# Patient Record
Sex: Female | Born: 1995
Health system: Southern US, Community
[De-identification: ages and names within clinical notes are randomized; demographics above are authoritative.]

## PROBLEM LIST (undated history)

## (undated) DIAGNOSIS — F32A Depression, unspecified: Secondary | ICD-10-CM

## (undated) DIAGNOSIS — F329 Major depressive disorder, single episode, unspecified: Secondary | ICD-10-CM

## (undated) HISTORY — DX: Depression, unspecified: F32.A

## (undated) HISTORY — DX: Major depressive disorder, single episode, unspecified: F32.9

## (undated) HISTORY — PX: OTHER SURGICAL HISTORY: SHX169

---
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
## 2014-10-07 ENCOUNTER — Emergency Department: Payer: Self-pay | Admitting: Emergency Medicine

## 2015-07-04 ENCOUNTER — Emergency Department
Admission: EM | Admit: 2015-07-04 | Discharge: 2015-07-04 | Disposition: A | Discharge status: Home / Self Care | Payer: Self-pay | Attending: Student | Admitting: Student

## 2015-07-04 ENCOUNTER — Encounter: Payer: Self-pay | Admitting: *Deleted

## 2015-07-04 DIAGNOSIS — J0300 Acute streptococcal tonsillitis, unspecified: Secondary | ICD-10-CM | POA: Insufficient documentation

## 2015-07-04 DIAGNOSIS — F17210 Nicotine dependence, cigarettes, uncomplicated: Secondary | ICD-10-CM | POA: Insufficient documentation

## 2015-07-04 MED ORDER — DIPHENHYDRAMINE HCL 12.5 MG/5ML PO ELIX
25.0000 mg | ORAL_SOLUTION | Freq: Once | ORAL | Status: AC
  Administered 2015-07-04: 25 mg via ORAL
  Filled 2015-07-04: qty 10

## 2015-07-04 MED ORDER — LIDOCAINE VISCOUS 2 % MT SOLN
15.0000 mL | Freq: Once | OROMUCOSAL | Status: AC
  Administered 2015-07-04: 15 mL via OROMUCOSAL
  Filled 2015-07-04: qty 15

## 2015-07-04 MED ORDER — LIDOCAINE VISCOUS 2 % MT SOLN: 5.0000 mL | Freq: Four times a day (QID) | OROMUCOSAL | Status: DC | PRN

## 2015-07-04 MED ORDER — AMOXICILLIN 500 MG PO CAPS: 500.0000 mg | ORAL_CAPSULE | Freq: Three times a day (TID) | ORAL | Status: DC

## 2015-07-04 MED ORDER — PSEUDOEPH-BROMPHEN-DM 30-2-10 MG/5ML PO SYRP: 5.0000 mL | ORAL_SOLUTION | Freq: Four times a day (QID) | ORAL | Status: DC | PRN

## 2015-07-04 NOTE — ED Notes (Signed)
Pt reports fever and sore throat since yesterday.

## 2015-07-04 NOTE — Discharge Instructions (Signed)
Strep Throat °Strep throat is an infection of the throat. It is caused by germs. Strep throat spreads from person to person because of coughing, sneezing, or close contact. °HOME CARE °Medicines  °· Take over-the-counter and prescription medicines only as told by your doctor. °· Take your antibiotic medicine as told by your doctor. Do not stop taking the medicine even if you feel better. °· Have family members who also have a sore throat or fever go to a doctor. °Eating and Drinking  °· Do not share food, drinking cups, or personal items. °· Try eating soft foods until your sore throat feels better. °· Drink enough fluid to keep your pee (urine) clear or pale yellow. °General Instructions °· Rinse your mouth (gargle) with a salt-water mixture 3-4 times per day or as needed. To make a salt-water mixture, stir ½-1 tsp of salt into 1 cup of warm water. °· Make sure that all people in your house wash their hands well. °· Rest. °· Stay home from school or work until you have been taking antibiotics for 24 hours. °· Keep all follow-up visits as told by your doctor. This is important. °GET HELP IF: °· Your neck keeps getting bigger. °· You get a rash, cough, or earache. °· You cough up thick liquid that is green, yellow-brown, or bloody. °· You have pain that does not get better with medicine. °· Your problems get worse instead of getting better. °· You have a fever. °GET HELP RIGHT AWAY IF: °· You throw up (vomit). °· You get a very bad headache. °· You neck hurts or it feels stiff. °· You have chest pain or you are short of breath. °· You have drooling, very bad throat pain, or changes in your voice. °· Your neck is swollen or the skin gets red and tender. °· Your mouth is dry or you are peeing less than normal. °· You keep feeling more tired or it is hard to wake up. °· Your joints are red or they hurt. °  °This information is not intended to replace advice given to you by your health care provider. Make sure you  discuss any questions you have with your health care provider. °  °Document Released: 12/17/2007 Document Revised: 03/21/2015 Document Reviewed: 10/23/2014 °Elsevier Interactive Patient Education ©2016 [employer] ° °

## 2015-07-04 NOTE — ED Provider Notes (Signed)
[facility] Emergency Department Provider Note  ____________________________________________  Time seen: Approximately 8:17 AM  I have reviewed the triage vital signs and the nursing notes.   HISTORY  Chief Complaint Sore Throat    HPI Patricia Wiley is a 19 y.o. female patient complaining of fever and sore throat since yesterday. Patient was up on a and awakened increasing soreness of the throat and decreased voice volume. Patient stated there is mild painswallowing but able to tolerate foods and fluids. Patient denies any other URI signs or symptoms. Patient stating Tylenol for the fever. Other palliative measures taken. Patient rates the pain discomfort as a 6/10. Patient scratched the pain as achy.   History reviewed. No pertinent past medical history.  There are no active problems to display for this patient.   History reviewed. No pertinent past surgical history.  Current Outpatient Rx  Name  Route  Sig  Dispense  Refill  . amoxicillin (AMOXIL) 500 MG capsule   Oral   Take 1 capsule (500 mg total) by mouth 3 (three) times daily.   30 capsule   0   . brompheniramine-pseudoephedrine-DM 30-2-10 MG/5ML syrup   Oral   Take 5 mLs by mouth 4 (four) times daily as needed. Mixed with 5 mL of viscous lidocaine for swish and swallow.   120 mL   0   . lidocaine (XYLOCAINE) 2 % solution   Mouth/Throat   Use as directed 5 mLs in the mouth or throat every 6 (six) hours as needed for mouth pain. Mixed with 5 mL of Bromfed-DM for swish and swallow.   100 mL   0     Allergies Review of patient's allergies indicates no known allergies.  No family history on file.  Social History Social History  Substance Use Topics  . Smoking status: Current Every Day Smoker -- 1.00 packs/day    Types: Cigarettes  . Smokeless tobacco: None  . Alcohol Use: No    Review of Systems Constitutional: No fever/chills Eyes: No visual changes. ENT: Sore throat  and decreased forced volume. Cardiovascular: Denies chest pain. Respiratory: Denies shortness of breath. Gastrointestinal: No abdominal pain.  No nausea, no vomiting.  No diarrhea.  No constipation. Genitourinary: Negative for dysuria. Musculoskeletal: Negative for back pain. Skin: Negative for rash. Neurological: Negative for headaches, focal weakness or numbness. 10-point ROS otherwise negative.  ____________________________________________   PHYSICAL EXAM:  VITAL SIGNS: ED Triage Vitals  Enc Vitals Group     BP 07/04/15 0755 136/69 mmHg     Pulse Rate 07/04/15 0755 110     Resp 07/04/15 0755 20     Temp 07/04/15 0755 100.6 F (38.1 C)     Temp Source 07/04/15 0755 Oral     SpO2 07/04/15 0755 98 %     Weight 07/04/15 0755 125 lb (56.7 kg)     Height 07/04/15 0755  (1.575 m)     Head Cir --      Peak Flow --      Pain Score 07/04/15 0756 6     Pain Loc --      Pain Edu? --      Excl. in GC? --    Constitutional: Alert and oriented. Well appearing and in no acute distress. Elevated temperature Eyes: Conjunctivae are normal. PERRL. EOMI. Head: Atraumatic. Nose: No congestion/rhinnorhea. Mouth/Throat: Mucous membranes are moist.  Oropharynx erythematous with bilateral tonsillar exudate. Neck: No stridor. No cervical spine tenderness to palpation. Hematological/Lymphatic/Immunilogical: Bilateral cervical lymphadenopathy.  Cardiovascular: Normal rate, regular rhythm. Grossly normal heart sounds.  Good peripheral circulation. Respiratory: Normal respiratory effort.  No retractions. Lungs CTAB. Gastrointestinal: Soft and nontender. No distention. No abdominal bruits. No CVA tenderness. Musculoskeletal: No lower extremity tenderness nor edema.  No joint effusions. Neurologic:  Normal speech and language. No gross focal neurologic deficits are appreciated. No gait instability. Skin:  Skin is warm, dry and intact. No rash noted. Psychiatric: Mood and affect are normal.  Speech and behavior are normal.  ____________________________________________   LABS (all labs ordered are listed, but only abnormal results are displayed)  Labs Reviewed - No data to display ____________________________________________  EKG   ____________________________________________  RADIOLOGY   ____________________________________________   PROCEDURES  Procedure(s) performed: None  Critical Care performed: No  ____________________________________________   INITIAL IMPRESSION / ASSESSMENT AND PLAN / ED COURSE  Pertinent labs & imaging results that were available during my care of the patient were reviewed by me and considered in my medical decision making (see chart for details). Tonsillitis. Patient given a prescription for amoxicillin, viscous lidocaine, Bromfed-DM, and ibuprofen. Patient given discharge care instructions and advised [facility]. ____________________________________________   FINAL CLINICAL IMPRESSION(S) / ED DIAGNOSES  Final diagnoses:  Streptococcal tonsillitis      Joni ReiningRonald K Katai Marsico, PA-C 07/04/15 [id number]  Gayla DossEryka A Gayle, MD 07/04/15 [phone]

## 2015-07-16 LAB — POCT RAPID STREP A: Streptococcus, Group A Screen (Direct): POSITIVE — AB

## 2016-03-01 DIAGNOSIS — F17210 Nicotine dependence, cigarettes, uncomplicated: Secondary | ICD-10-CM | POA: Insufficient documentation

## 2016-03-01 DIAGNOSIS — K0889 Other specified disorders of teeth and supporting structures: Secondary | ICD-10-CM | POA: Insufficient documentation

## 2016-03-02 ENCOUNTER — Emergency Department
Admission: EM | Admit: 2016-03-02 | Discharge: 2016-03-02 | Disposition: A | Discharge status: Home / Self Care | Payer: Self-pay | Attending: Emergency Medicine | Admitting: Emergency Medicine

## 2016-03-02 ENCOUNTER — Encounter: Payer: Self-pay | Admitting: Emergency Medicine

## 2016-03-02 DIAGNOSIS — K0889 Other specified disorders of teeth and supporting structures: Secondary | ICD-10-CM

## 2016-03-02 MED ORDER — PENICILLIN V POTASSIUM 250 MG PO TABS: 250.0000 mg | ORAL_TABLET | Freq: Four times a day (QID) | ORAL | 0 refills | Status: DC

## 2016-03-02 MED ORDER — TRAMADOL HCL 50 MG PO TABS: 50.0000 mg | ORAL_TABLET | Freq: Four times a day (QID) | ORAL | 0 refills | Status: AC | PRN

## 2016-03-02 MED ORDER — TRAMADOL HCL 50 MG PO TABS
50.0000 mg | ORAL_TABLET | Freq: Once | ORAL | Status: AC
  Administered 2016-03-02: 50 mg via ORAL
  Filled 2016-03-02: qty 1

## 2016-03-02 NOTE — ED Provider Notes (Signed)
[facility] Emergency Department Provider Note   ____________________________________________    I have reviewed the triage vital signs and the nursing notes.   HISTORY  Chief Complaint Dental Pain     HPI Patricia Wiley is a 20 y.o. female Who presents with complaints of left posterior dental pain. She reports it is likely being caused by her wisdom teeth which are emerging. She complains of swelling in the left side of her face. She took an amoxicillin earlier today. She tried taking Motrin with little relief. She denies difficulty breathing or swallowing. No fevers or chills. No nausea or vomiting.   History reviewed. No pertinent past medical history.  There are no active problems to display for this patient.   History reviewed. No pertinent surgical history.  Prior to Admission medications   Medication Sig Start Date End Date Taking? Authorizing Provider  amoxicillin (AMOXIL) 500 MG capsule Take 1 capsule (500 mg total) by mouth 3 (three) times daily. 07/04/15   Joni Reiningonald K Smith, PA-C  brompheniramine-pseudoephedrine-DM 30-2-10 MG/5ML syrup Take 5 mLs by mouth 4 (four) times daily as needed. Mixed with 5 mL of viscous lidocaine for swish and swallow. 07/04/15   Joni Reiningonald K Smith, PA-C  lidocaine (XYLOCAINE) 2 % solution Use as directed 5 mLs in the mouth or throat every 6 (six) hours as needed for mouth pain. Mixed with 5 mL of Bromfed-DM for swish and swallow. 07/04/15   Joni Reiningonald K Smith, PA-C  penicillin v potassium (VEETID) 250 MG tablet Take 1 tablet (250 mg total) by mouth 4 (four) times daily. 03/02/16   Jene Everyobert Burel Kahre, MD  traMADol (ULTRAM) 50 MG tablet Take 1 tablet (50 mg total) by mouth every 6 (six) hours as needed. 03/02/16 03/02/17  Jene Everyobert Kass Herberger, MD     Allergies Review of patient's allergies indicates no known allergies.  No family history on file.  Social History Social History  Substance Use Topics  . Smoking status: Current  Every Day Smoker    Packs/day: 1.00    Types: Cigarettes  . Smokeless tobacco: Never Used  . Alcohol use Yes     Comment: occasionaly    Review of Systems  Constitutional: No fever/chills  ENT: No sore throat.       Skin: Negative for rash. Neurological: Negative for headaches     ____________________________________________   PHYSICAL EXAM:  VITAL SIGNS: ED Triage Vitals  Enc Vitals Group     BP 03/02/16 0005 (!) 124/92     Pulse Rate 03/02/16 0005 (!) 108     Resp 03/02/16 0005 18     Temp 03/02/16 0005 98 F (36.7 C)     Temp Source 03/02/16 0005 Oral     SpO2 03/02/16 0005 98 %     Weight 03/02/16 0006 135 lb (61.2 kg)     Height 03/02/16 0006 5\' 2"  (1.575 m)     Head Circumference --      Peak Flow --      Pain Score 03/02/16 0006 8     Pain Loc --      Pain Edu? --      Excl. in GC? --      Constitutional: Alert and oriented. No acute distress. Eyes: Conjunctivae are normal.  Head: Atraumatic. Nose: No congestion/rhinnorhea. Mouth/Throat: Mucous membranes are moist.  Tenderness to palpation overlying her left lower wisdom tooth, no drainable abscess. Mild swelling to the left cheek. No swelling to the floor of the mouth. Normal  pharynx  Respiratory: Normal respiratory effort.  No retractions.   Neurologic:  Normal speech and language. No gross focal neurologic deficits are appreciated.   Skin:  Skin is warm, dry and intact. No rash noted.   ____________________________________________   LABS (all labs ordered are listed, but only abnormal results are displayed)  Labs Reviewed - No data to display ____________________________________________  EKG   ____________________________________________  RADIOLOGY   ____________________________________________   PROCEDURES  Procedure(s) performed: No    Critical Care performed: No ____________________________________________   INITIAL IMPRESSION / ASSESSMENT AND PLAN / ED  COURSE  Pertinent labs & imaging results that were available during my care of the patient were reviewed by me and considered in my medical decision making (see chart for details).  Patient well. No distress. Nontoxic. We will start her on penicillin, provided analgesics and have her follow up with dentist in 2 days. Return precautions discussed   ____________________________________________   FINAL CLINICAL IMPRESSION(S) / ED DIAGNOSES  Final diagnoses:  Pain, dental      NEW MEDICATIONS STARTED DURING THIS VISIT:  Discharge Medication List as of 03/02/2016 12:20 AM    START taking these medications   Details  penicillin v potassium (VEETID) 250 MG tablet Take 1 tablet (250 mg total) by mouth 4 (four) times daily., Starting Sun 03/02/2016, Print    traMADol (ULTRAM) 50 MG tablet Take 1 tablet (50 mg total) by mouth every 6 (six) hours as needed., Starting Sun 03/02/2016, Until Mon 03/02/2017, Print         Note:  This document was prepared using Dragon voice recognition software and may include unintentional dictation errors.    Jene Everyobert Lileigh Fahringer, MD 03/02/16 [phone]

## 2016-03-02 NOTE — ED Triage Notes (Signed)
Patient with complaint of pain to her left lower wisdom tooth that started yesterday. Patient developed swelling today to left cheek.

## 2017-08-25 ENCOUNTER — Emergency Department: Payer: Self-pay

## 2017-08-25 ENCOUNTER — Encounter: Payer: Self-pay | Admitting: Emergency Medicine

## 2017-08-25 ENCOUNTER — Emergency Department
Admission: EM | Admit: 2017-08-25 | Discharge: 2017-08-25 | Disposition: A | Discharge status: Home / Self Care | Payer: Self-pay | Attending: Emergency Medicine | Admitting: Emergency Medicine

## 2017-08-25 ENCOUNTER — Other Ambulatory Visit: Payer: Self-pay

## 2017-08-25 DIAGNOSIS — R079 Chest pain, unspecified: Secondary | ICD-10-CM

## 2017-08-25 DIAGNOSIS — F17210 Nicotine dependence, cigarettes, uncomplicated: Secondary | ICD-10-CM | POA: Insufficient documentation

## 2017-08-25 LAB — CBC
HCT: 44.5 % (ref 35.0–47.0)
Hemoglobin: 15 g/dL (ref 12.0–16.0)
MCH: 30.4 pg (ref 26.0–34.0)
MCHC: 33.7 g/dL (ref 32.0–36.0)
MCV: 90 fL (ref 80.0–100.0)
Platelets: 394 10*3/uL (ref 150–440)
RBC: 4.94 MIL/uL (ref 3.80–5.20)
RDW: 13.1 % (ref 11.5–14.5)
WBC: 5.5 10*3/uL (ref 3.6–11.0)

## 2017-08-25 LAB — BASIC METABOLIC PANEL
Anion gap: 9 (ref 5–15)
BUN: 8 mg/dL (ref 6–20)
CO2: 26 mmol/L (ref 22–32)
Calcium: 10.1 mg/dL (ref 8.9–10.3)
Chloride: 103 mmol/L (ref 101–111)
Creatinine, Ser: 0.59 mg/dL (ref 0.44–1.00)
GFR calc Af Amer: >60 mL/min (ref >60)
GFR calc non Af Amer: >60 mL/min (ref >60)
Glucose, Bld: 98 mg/dL (ref 65–99)
Potassium: 3.4 mmol/L — ABNORMAL LOW (ref 3.5–5.1)
Sodium: 138 mmol/L (ref 135–145)

## 2017-08-25 LAB — TROPONIN I: Troponin I: <0.03 ng/mL (ref <0.03)

## 2017-08-25 LAB — POCT PREGNANCY, URINE: Preg Test, Ur: NEGATIVE

## 2017-08-25 NOTE — ED Provider Notes (Signed)
[facility] Emergency Department Provider Note ____________________________________________   I have reviewed the triage vital signs and the triage nursing note.  HISTORY  Chief Complaint Chest Pain   Historian Patient  HPI Patricia Wiley is a 22 y.o. female with no signif pmh, presents with onset of left sided anterior cp that radiated to left neck and up to left shoulder and left arm with tingling this am from around 6:30-8am.  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  Initially she thought it might be gas, but no real burning.  Pain felt tight.  No shortness of breath or pleuritic chest pain.  No coughing or fevers.  No abdominal pain.  She has had this before, but did not really last as long.  No known trauma or overuse injury.  Not on birth control.  Currently gone.   History reviewed. No pertinent past medical history.  There are no active problems to display for this patient.   History reviewed. No pertinent surgical history.  Prior to Admission medications   Medication Sig Start Date End Date Taking? Authorizing Provider  amoxicillin (AMOXIL) 500 MG capsule Take 1 capsule (500 mg total) by mouth 3 (three) times daily. 07/04/15   Joni ReiningSmith, Ronald K, PA-C  brompheniramine-pseudoephedrine-DM 30-2-10 MG/5ML syrup Take 5 mLs by mouth 4 (four) times daily as needed. Mixed with 5 mL of viscous lidocaine for swish and swallow. 07/04/15   Joni ReiningSmith, Ronald K, PA-C  lidocaine (XYLOCAINE) 2 % solution Use as directed 5 mLs in the mouth or throat every 6 (six) hours as needed for mouth pain. Mixed with 5 mL of Bromfed-DM for swish and swallow. 07/04/15   Joni ReiningSmith, Ronald K, PA-C  penicillin v potassium (VEETID) 250 MG tablet Take 1 tablet (250 mg total) by mouth 4 (four) times daily. 03/02/16   Jene EveryKinner, Robert, MD    No Known Allergies  No family history on file.  Social History Social History   Tobacco Use  . Smoking status: Current Every Day Smoker   Packs/day: 1.00    Types: Cigarettes  . Smokeless tobacco: Never Used  Substance Use Topics  . Alcohol use: Yes    Comment: occasionaly  . Drug use: No    Review of Systems  Constitutional: Negative for fever. Eyes: Negative for visual changes. ENT: Negative for sore throat. Cardiovascular: Positive as per HPI for chest pain. Respiratory: Negative for shortness of breath. Gastrointestinal: Negative for abdominal pain, vomiting and diarrhea. Genitourinary: Negative for dysuria. Musculoskeletal: Negative for back pain. Skin: Negative for rash. Neurological: Negative for headache.  ____________________________________________   PHYSICAL EXAM:  VITAL SIGNS: ED Triage Vitals  Enc Vitals Group     BP 08/25/17 0910 125/87     Pulse Rate 08/25/17 0910 97     Resp 08/25/17 0910 16     Temp 08/25/17 0910 98.1 F (36.7 C)     Temp Source 08/25/17 0910 Oral     SpO2 08/25/17 0910 100 %     Weight 08/25/17 0910 135 lb (61.2 kg)     Height 08/25/17 0910 5\' 2"  (1.575 m)     Head Circumference --      Peak Flow --      Pain Score 08/25/17 0916 4     Pain Loc --      Pain Edu? --      Excl. in GC? --      Constitutional: Alert and oriented. Well appearing and in no distress. HEENT  Head: Normocephalic and atraumatic.      Eyes: Conjunctivae are normal. Pupils equal and round.       Ears:         Nose: No congestion/rhinnorhea.   Mouth/Throat: Mucous membranes are moist.   Neck: No stridor. Cardiovascular/Chest: Normal rate, regular rhythm.  No murmurs, rubs, or gallops. Respiratory: Normal respiratory effort without tachypnea nor retractions. Breath sounds are clear and equal bilaterally. No wheezes/rales/rhonchi. Gastrointestinal: Soft. No distention, no guarding, no rebound. Nontender.    Genitourinary/rectal:Deferred Musculoskeletal: Tender and spastic left trapezius muscle.  Nontender with normal range of motion in all extremities. No joint effusions.  No  lower extremity tenderness.  No edema. Neurologic:  Normal speech and language. No gross or focal neurologic deficits are appreciated. Skin:  Skin is warm, dry and intact. No rash noted. Psychiatric: Mood and affect are normal. Speech and behavior are normal. Patient exhibits appropriate insight and judgment.   ____________________________________________  LABS (pertinent positives/negatives) I, Governor Rooks, MD the attending physician have reviewed the labs noted below.  Labs Reviewed  BASIC METABOLIC PANEL - Abnormal; Notable for the following components:      Result Value   Potassium 3.4 (*)    All other components within normal limits  CBC  TROPONIN I  POC URINE PREG, ED  POCT PREGNANCY, URINE    ____________________________________________    EKG I, Governor Rooks, MD, the attending physician have personally viewed and interpreted all ECGs.  80 bpm.  Normal sinus rhythm.  Narrow QRS.  Rightward axis.  Nonspecific T wave ____________________________________________  RADIOLOGY All Xrays were viewed by me.  Imaging interpreted by Radiologist, and I, Governor Rooks, MD the attending physician have reviewed the radiologist interpretation noted below.  Chest x-ray 2 view no acute findings  Radiology report:  IMPRESSION: No edema or consolidation. __________________________________________  PROCEDURES  Procedure(s) performed: None  Critical Care performed: None   ____________________________________________  ED COURSE / ASSESSMENT AND PLAN  Pertinent labs & imaging results that were available during my care of the patient were reviewed by me and considered in my medical decision making (see chart for details).    Reassuring exam and evaluation.  Symptoms not consistent with cardiac or pulmonary or neurologic emergency including PE not suspected clinically and PERC neg.  She does have tender left trap muscle spasm, we discussed likely musculoskeletal pain and have  referred for primary care follow up.  DIFFERENTIAL DIAGNOSIS: Differential diagnosis includes, but is not limited to, ACS, aortic dissection, pulmonary embolism, cardiac tamponade, pneumothorax, pneumonia, pericarditis, myocarditis, GI-related causes including esophagitis/gastritis, and musculoskeletal chest wall pain.    CONSULTATIONS:   None   Patient / Family / Caregiver informed of clinical course, medical decision-making process, and agree with plan.   I discussed return precautions, follow-up instructions, and discharge instructions with patient and/or family.  Discharge Instructions : You were evaluated for chest pain, and although no certain cause was found, your exam and evaluation are reassuring in the ER today.  I am most suspicious of musculoskeletal pain - and muscle spasm - you may try heat, massage and ibuprofen use as directed on label.  Return to the ER for any new or worsening condition including uncontrolled chest pain, any trouble breathing, pain with breathing, fever, coughing up blood, abdominal pain, weakness, numbness, dizziness, passing out, palpitations, or any other symptoms concerning to you.    ___________________________________________   FINAL CLINICAL IMPRESSION(S) / ED DIAGNOSES   Final diagnoses:  Nonspecific chest pain  ___________________________________________        Note: This dictation was prepared with Dragon dictation. Any transcriptional errors that result from this process are unintentional    Governor Rooks, MD 08/25/17 1036

## 2017-08-25 NOTE — Discharge Instructions (Signed)
You were evaluated for chest pain, and although no certain cause was found, your exam and evaluation are reassuring in the ER today.  I am most suspicious of musculoskeletal pain - and muscle spasm - you may try heat, massage and ibuprofen use as directed on label.  Return to the ER for any new or worsening condition including uncontrolled chest pain, any trouble breathing, pain with breathing, fever, coughing up blood, abdominal pain, weakness, numbness, dizziness, passing out, palpitations, or any other symptoms concerning to you.

## 2017-08-25 NOTE — ED Notes (Signed)
ED Provider at bedside. 

## 2017-08-25 NOTE — ED Notes (Signed)
Pt alert and oriented X4, active, cooperative, pt in NAD. RR even and unlabored, color WNL.  Pt informed to return if any life threatening symptoms occur.  Discharge and followup instructions reviewed.  

## 2017-08-25 NOTE — ED Triage Notes (Signed)
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 States that she has CP a few times per month. Describes pain as aching and pressure to left side of chest, radiating to left shoulder and left side of neck. Took 2 advil PTA. No recent injuries or strenuous activity. Pt alert and oriented X4, active, cooperative, pt in NAD. RR even and unlabored, color WNL.

## 2018-02-10 DIAGNOSIS — R55 Syncope and collapse: Secondary | ICD-10-CM | POA: Diagnosis not present

## 2018-02-10 DIAGNOSIS — R109 Unspecified abdominal pain: Secondary | ICD-10-CM | POA: Diagnosis not present

## 2018-02-10 DIAGNOSIS — R1013 Epigastric pain: Secondary | ICD-10-CM | POA: Diagnosis not present

## 2018-09-27 ENCOUNTER — Ambulatory Visit: Payer: BLUE CROSS/BLUE SHIELD | Admitting: Adult Health

## 2018-09-27 ENCOUNTER — Encounter: Payer: Self-pay | Admitting: Adult Health

## 2018-09-27 VITALS — BP 112/72 | HR 84 | Resp 16 | Ht 62.0 in | Wt 142.0 lb

## 2018-09-27 DIAGNOSIS — Z01419 Encounter for gynecological examination (general) (routine) without abnormal findings: Secondary | ICD-10-CM | POA: Diagnosis not present

## 2018-09-27 DIAGNOSIS — F419 Anxiety disorder, unspecified: Secondary | ICD-10-CM | POA: Diagnosis not present

## 2018-09-27 DIAGNOSIS — F17200 Nicotine dependence, unspecified, uncomplicated: Secondary | ICD-10-CM

## 2018-09-27 DIAGNOSIS — F339 Major depressive disorder, recurrent, unspecified: Secondary | ICD-10-CM | POA: Diagnosis not present

## 2018-09-27 NOTE — Progress Notes (Signed)
[facility] [address] [place], [place] [address]  Internal MEDICINE  Office Visit Note  Patient Name: Patricia Wiley  [id number]  [id number]  Date of Service: 09/27/2018   Complaints/HPI Pt is here for establishment of PCP. Chief Complaint  Patient presents with  . Depression    need form for work filled out , new patient establishing care    HPI Pt is here to establish care. She is a 23 yo caucasian female.  She is currently working at an [employer].  She currently only takes Celexa which is prescribed for her depression/anxiety by Dr. Elesa Massed at [facility].  She follows up with them every 3 months. She has a long time girlfriend of 8 years that she lives with. She denies any new issues currently.  She is here to establish care for a Yearly physcial to get the discount on her health insurance. She reports using tobacco, approximately 1 Ppd, and drinks alcohol occasionally.  Denies illicit drug use.      Current Medication: Outpatient Encounter Medications as of 09/27/2018  Medication Sig  . citalopram (CELEXA) 10 MG tablet TK 1 T PO QD  . [DISCONTINUED] amoxicillin (AMOXIL) 500 MG capsule Take 1 capsule (500 mg total) by mouth 3 (three) times daily.  . [DISCONTINUED] brompheniramine-pseudoephedrine-DM 30-2-10 MG/5ML syrup Take 5 mLs by mouth 4 (four) times daily as needed. Mixed with 5 mL of viscous lidocaine for swish and swallow.  . [DISCONTINUED] lidocaine (XYLOCAINE) 2 % solution Use as directed 5 mLs in the mouth or throat every 6 (six) hours as needed for mouth pain. Mixed with 5 mL of Bromfed-DM for swish and swallow.  . [DISCONTINUED] penicillin v potassium (VEETID) 250 MG tablet Take 1 tablet (250 mg total) by mouth 4 (four) times daily.   No facility-administered encounter medications on file as of 09/27/2018.     Surgical History: History reviewed. No pertinent surgical history.  Medical History: Past Medical History:  Diagnosis Date  . Depression      Family History: Family History  Problem Relation Age of Onset  . Heart disease Mother     Social History   Socioeconomic History  . Marital status: Single    Spouse name: Not on file  . Number of children: Not on file  . Years of education: Not on file  . Highest education level: Not on file  Occupational History  . Not on file  Social Needs  . Financial resource strain: Not on file  . Food insecurity:    Worry: Not on file    Inability: Not on file  . Transportation needs:    Medical: Not on file    Non-medical: Not on file  Tobacco Use  . Smoking status: Current Every Day Smoker    Packs/day: 1.00    Types: Cigarettes  . Smokeless tobacco: Never Used  Substance and Sexual Activity  . Alcohol use: Yes    Comment: occasionaly  . Drug use: No  . Sexual activity: Not on file  Lifestyle  . Physical activity:    Days per week: Not on file    Minutes per session: Not on file  . Stress: Not on file  Relationships  . Social connections:    Talks on phone: Not on file    Gets together: Not on file    Attends religious service: Not on file    Active member of club or organization: Not on file    Attends meetings of clubs or  organizations: Not on file    Relationship status: Not on file  . Intimate partner violence:    Fear of current or ex partner: Not on file    Emotionally abused: Not on file    Physically abused: Not on file    Forced sexual activity: Not on file  Other Topics Concern  . Not on file  Social History Narrative  . Not on file     Review of Systems  Constitutional: Negative for chills, fatigue and unexpected weight change.  HENT: Negative for congestion, rhinorrhea, sneezing and sore throat.   Eyes: Negative for photophobia, pain and redness.  Respiratory: Negative for cough, chest tightness and shortness of breath.   Cardiovascular: Negative for chest pain and palpitations.  Gastrointestinal: Negative for abdominal pain, constipation,  diarrhea, nausea and vomiting.  Endocrine: Negative.   Genitourinary: Negative for dysuria and frequency.  Musculoskeletal: Negative for arthralgias, back pain, joint swelling and neck pain.  Skin: Negative for rash.  Allergic/Immunologic: Negative.   Neurological: Negative for tremors and numbness.  Hematological: Negative for adenopathy. Does not bruise/bleed easily.  Psychiatric/Behavioral: Negative for behavioral problems and sleep disturbance. The patient is not nervous/anxious.     Vital Signs: BP 112/72   Pulse 84   Resp 16   Ht 5\' 2"  (1.575 m)   Wt 142 lb (64.4 kg)   SpO2 99%   BMI 25.97 kg/m    Physical Exam Vitals signs and nursing note reviewed.  Constitutional:      General: She is not in acute distress.    Appearance: She is well-developed. She is not diaphoretic.  HENT:     Head: Normocephalic and atraumatic.     Mouth/Throat:     Pharynx: No oropharyngeal exudate.  Eyes:     Pupils: Pupils are equal, round, and reactive to light.  Neck:     Musculoskeletal: Normal range of motion and neck supple.     Thyroid: No thyromegaly.     Vascular: No JVD.     Trachea: No tracheal deviation.  Cardiovascular:     Rate and Rhythm: Normal rate and regular rhythm.     Heart sounds: Normal heart sounds. No murmur. No friction rub. No gallop.   Pulmonary:     Effort: Pulmonary effort is normal. No respiratory distress.     Breath sounds: Normal breath sounds. No wheezing or rales.  Chest:     Chest wall: No tenderness.  Abdominal:     Palpations: Abdomen is soft.     Tenderness: There is no abdominal tenderness. There is no guarding.  Musculoskeletal: Normal range of motion.  Lymphadenopathy:     Cervical: No cervical adenopathy.  Skin:    General: Skin is warm and dry.  Neurological:     Mental Status: She is alert and oriented to person, place, and time.     Cranial Nerves: No cranial nerve deficit.  Psychiatric:        Behavior: Behavior normal.         Thought Content: Thought content normal.        Judgment: Judgment normal.    Assessment/Plan: 1. Anxiety Stable at this time. Will discuss further at future visits.   2. Depression, recurrent (HCC) Pt currently taking celexa.  She denies overt issues.  Will continue to monitor at future visits.   3. Pap smear, as part of routine gynecological examination Pt requesting a referral to GYN. Placed at this time.  - Ambulatory referral to Gynecology  4. Nicotine dependence with current use Smoking cessation counseling: 1. Pt acknowledges the risks of long term smoking, she will try to quite smoking. 2. Options for different medications including nicotine products, chewing gum, patch etc, Wellbutrin and Chantix is discussed 3. Goal and date of compete cessation is discussed 4. Total time spent in smoking cessation is 15 min.  General Counseling: [name] verbalizes understanding of the findings of todays visit and agrees with plan of treatment. I have discussed any further diagnostic evaluation that may be needed or ordered today. We also reviewed her medications today. she has been encouraged to call the office with any questions or concerns that should arise related to todays visit.  No orders of the defined types were placed in this encounter.   No orders of the defined types were placed in this encounter.   Time spent: 25 Minutes   This patient was seen by Blima Ledger AGNP-C in Collaboration with Dr Lyndon Code as a part of collaborative care agreement  Johnna Acosta AGNP-C Internal Medicine

## 2018-09-27 NOTE — Patient Instructions (Signed)
Pap Test  Why am I having this test?  A Pap test, also called a Pap smear, is a screening test to check for signs of:  · Cancer of the vagina, cervix, and uterus. The cervix is the lower part of the uterus that opens into the vagina.  · Infection.  · Changes that may be a sign that cancer is developing (precancerous changes).  Women need this test on a regular basis. In general, you should have a Pap test every 3 years until you reach menopause or age 24. Women aged 30-60 may choose to have their Pap test done at the same time as an HPV (human papillomavirus) test every 5 years (instead of every 3 years).  Your health care provider may recommend having Pap tests more or less often depending on your medical conditions and past Pap test results.  What kind of sample is taken?    Your health care provider will collect a sample of cells from the surface of your cervix. This will be done using a small cotton swab, plastic spatula, or brush. This sample is often collected during a pelvic exam, when you are lying on your back on an exam table with feet in footrests (stirrups).  In some cases, fluids (secretions) from the cervix or vagina may also be collected.  How do I prepare for this test?  · Be aware of where you are in your menstrual cycle. If you are menstruating on the day of the test, you may be asked to reschedule.  · You may need to reschedule if you have a known vaginal infection on the day of the test.  · Follow instructions from your health care provider about:  ? Changing or stopping your regular medicines. Some medicines can cause abnormal test results, such as digitalis and tetracycline.  ? Avoiding douching or taking a bath the day before or the day of the test.  Tell a health care provider about:  · Any allergies you have.  · All medicines you are taking, including vitamins, herbs, eye drops, creams, and over-the-counter medicines.  · Any blood disorders you have.  · Any surgeries you have had.  · Any  medical conditions you have.  · Whether you are pregnant or may be pregnant.  How are the results reported?  Your test results will be reported as either abnormal or normal.  A false-positive result can occur. A false positive is incorrect because it means that a condition is present when it is not.  A false-negative result can occur. A false negative is incorrect because it means that a condition is not present when it is.  What do the results mean?  A normal test result means that you do not have signs of cancer of the vagina, cervix, or uterus.  An abnormal result may mean that you have:  · Cancer. A Pap test by itself is not enough to diagnose cancer. You will have more tests done in this case.  · Precancerous changes in your vagina, cervix, or uterus.  · Inflammation of the cervix.  · An STD (sexually transmitted disease).  · A fungal infection.  · A parasite infection.  Talk with your health care provider about what your results mean.  Questions to ask your health care provider  Ask your health care provider, or the department that is doing the test:  · When will my results be ready?  · How will I get my results?  · What are my   treatment options?  · What other tests do I need?  · What are my next steps?  Summary  · In general, women should have a Pap test every 3 years until they reach menopause or age 24.  · Your health care provider will collect a sample of cells from the surface of your cervix. This will be done using a small cotton swab, plastic spatula, or brush.  · In some cases, fluids (secretions) from the cervix or vagina may also be collected.  This information is not intended to replace advice given to you by your health care provider. Make sure you discuss any questions you have with your health care provider.  Document Released: 09/20/2002 Document Revised: 03/09/2017 Document Reviewed: 03/09/2017  Elsevier Interactive Patient Education © 2019 Elsevier Inc.

## 2018-09-29 ENCOUNTER — Encounter: Payer: Self-pay | Admitting: Adult Health

## 2018-10-06 ENCOUNTER — Other Ambulatory Visit: Payer: Self-pay

## 2018-10-06 ENCOUNTER — Ambulatory Visit (INDEPENDENT_AMBULATORY_CARE_PROVIDER_SITE_OTHER): Payer: BLUE CROSS/BLUE SHIELD | Admitting: Adult Health

## 2018-10-06 ENCOUNTER — Encounter: Payer: Self-pay | Admitting: Adult Health

## 2018-10-06 VITALS — BP 112/72 | HR 98 | Resp 16 | Ht 62.0 in | Wt 144.0 lb

## 2018-10-06 DIAGNOSIS — R300 Dysuria: Secondary | ICD-10-CM | POA: Diagnosis not present

## 2018-10-06 DIAGNOSIS — F419 Anxiety disorder, unspecified: Secondary | ICD-10-CM | POA: Diagnosis not present

## 2018-10-06 DIAGNOSIS — Z0001 Encounter for general adult medical examination with abnormal findings: Secondary | ICD-10-CM

## 2018-10-06 DIAGNOSIS — F17200 Nicotine dependence, unspecified, uncomplicated: Secondary | ICD-10-CM | POA: Diagnosis not present

## 2018-10-06 DIAGNOSIS — F339 Major depressive disorder, recurrent, unspecified: Secondary | ICD-10-CM

## 2018-10-06 NOTE — Patient Instructions (Signed)
Coping with Quitting Smoking  Quitting smoking is a physical and mental challenge. You will face cravings, withdrawal symptoms, and temptation. Before quitting, work with your health care provider to make a plan that can help you cope. Preparation can help you quit and keep you from giving in. How can I cope with cravings? Cravings usually last for 5-10 minutes. If you get through it, the craving will pass. Consider taking the following actions to help you cope with cravings:  Keep your mouth busy: ? Chew sugar-free gum. ? Suck on hard candies or a straw. ? Brush your teeth.  Keep your hands and body busy: ? Immediately change to a different activity when you feel a craving. ? Squeeze or play with a ball. ? Do an activity or a hobby, like making bead jewelry, practicing needlepoint, or working with wood. ? Mix up your normal routine. ? Take a short exercise break. Go for a quick walk or run up and down stairs. ? Spend time in public places where smoking is not allowed.  Focus on doing something kind or helpful for someone else.  Call a friend or family member to talk during a craving.  Join a support group.  Call a quit line, such as 1-800-QUIT-NOW.  Talk with your health care provider about medicines that might help you cope with cravings and make quitting easier for you. How can I deal with withdrawal symptoms? Your body may experience negative effects as it tries to get used to not having nicotine in the system. These effects are called withdrawal symptoms. They may include:  Feeling hungrier than normal.  Trouble concentrating.  Irritability.  Trouble sleeping.  Feeling depressed.  Restlessness and agitation.  Craving a cigarette. To manage withdrawal symptoms:  Avoid places, people, and activities that trigger your cravings.  Remember why you want to quit.  Get plenty of sleep.  Avoid coffee and other caffeinated drinks. These may worsen some of your symptoms.  How can I handle social situations? Social situations can be difficult when you are quitting smoking, especially in the first few weeks. To manage this, you can:  Avoid parties, bars, and other social situations where people might be smoking.  Avoid alcohol.  Leave right away if you have the urge to smoke.  Explain to your family and friends that you are quitting smoking. Ask for understanding and support.  Plan activities with friends or family where smoking is not an option. What are some ways I can cope with stress? Wanting to smoke may cause stress, and stress can make you want to smoke. Find ways to manage your stress. Relaxation techniques can help. For example:  Breathe slowly and deeply, in through your nose and out through your mouth.  Listen to soothing, relaxing music.  Talk with a family member or friend about your stress.  Light a candle.  Soak in a bath or take a shower.  Think about a peaceful place. What are some ways I can prevent weight gain? Be aware that many people gain weight after they quit smoking. However, not everyone does. To keep from gaining weight, have a plan in place before you quit and stick to the plan after you quit. Your plan should include:  Having healthy snacks. When you have a craving, it may help to: ? Eat plain popcorn, crunchy carrots, celery, or other cut vegetables. ? Chew sugar-free gum.  Changing how you eat: ? Eat small portion sizes at meals. ? Eat 4-6 small meals   throughout the day instead of 1-2 large meals a day. ? Be mindful when you eat. Do not watch television or do other things that might distract you as you eat.  Exercising regularly: ? Make time to exercise each day. If you do not have time for a long workout, do short bouts of exercise for 5-10 minutes several times a day. ? Do some form of strengthening exercise, like weight lifting, and some form of aerobic exercise, like running or swimming.  Drinking plenty of  water or other low-calorie or no-calorie drinks. Drink 6-8 glasses of water daily, or as much as instructed by your health care provider. Summary  Quitting smoking is a physical and mental challenge. You will face cravings, withdrawal symptoms, and temptation to smoke again. Preparation can help you as you go through these challenges.  You can cope with cravings by keeping your mouth busy (such as by chewing gum), keeping your body and hands busy, and making calls to family, friends, or a helpline for people who want to quit smoking.  You can cope with withdrawal symptoms by avoiding places where people smoke, avoiding drinks with caffeine, and getting plenty of rest.  Ask your health care provider about the different ways to prevent weight gain, avoid stress, and handle social situations. This information is not intended to replace advice given to you by your health care provider. Make sure you discuss any questions you have with your health care provider. Document Released: 06/27/2016 Document Revised: 06/27/2016 Document Reviewed: 06/27/2016 Elsevier Interactive Patient Education  2019 Elsevier Inc.  

## 2018-10-06 NOTE — Progress Notes (Signed)
[facility] [address] [place], [place] [address]  Internal MEDICINE  Office Visit Note  Patient Name: Patricia Wiley  [id number]  [id number]  Date of Service: 10/12/2018  Chief Complaint  Patient presents with  . Annual Exam  . Anxiety     HPI Pt is here for routine health maintenance examination.  She is a well appearing 23 yo female. Her medical history is only significant for anxiety/depression which she sees [facility] for.  She currently takes 10mg  Celexa with good relief of symptoms. She denies any current complaints.  She is in a long term relationship with her girlfriend of 8 years.  They live together, and both work.  No children presently.    Current Medication: Outpatient Encounter Medications as of 10/06/2018  Medication Sig  . citalopram (CELEXA) 10 MG tablet TK 1 T PO QD   No facility-administered encounter medications on file as of 10/06/2018.     Surgical History: History reviewed. No pertinent surgical history.  Medical History: Past Medical History:  Diagnosis Date  . Depression     Family History: Family History  Problem Relation Age of Onset  . Heart disease Mother       Review of Systems  Constitutional: Negative for chills, fatigue and unexpected weight change.  HENT: Negative for congestion, rhinorrhea, sneezing and sore throat.   Eyes: Negative for photophobia, pain and redness.  Respiratory: Negative for cough, chest tightness and shortness of breath.   Cardiovascular: Negative for chest pain and palpitations.  Gastrointestinal: Negative for abdominal pain, constipation, diarrhea, nausea and vomiting.  Endocrine: Negative.   Genitourinary: Negative for dysuria and frequency.  Musculoskeletal: Negative for arthralgias, back pain, joint swelling and neck pain.  Skin: Negative for rash.  Allergic/Immunologic: Negative.   Neurological: Negative for tremors and numbness.  Hematological: Negative for adenopathy. Does not  bruise/bleed easily.  Psychiatric/Behavioral: Negative for behavioral problems and sleep disturbance. The patient is not nervous/anxious.      Vital Signs: BP 112/72 (BP Location: Left Arm, Patient Position: Sitting, Cuff Size: Normal)   Pulse 98   Resp 16   Ht 5\' 2"  (1.575 m)   Wt 144 lb (65.3 kg)   SpO2 99%   BMI 26.34 kg/m    Physical Exam Vitals signs and nursing note reviewed. Exam conducted with a chaperone present.  Constitutional:      General: She is not in acute distress.    Appearance: She is well-developed. She is not diaphoretic.  HENT:     Head: Normocephalic and atraumatic.     Mouth/Throat:     Pharynx: No oropharyngeal exudate.  Eyes:     Pupils: Pupils are equal, round, and reactive to light.  Neck:     Musculoskeletal: Normal range of motion and neck supple.     Thyroid: No thyromegaly.     Vascular: No JVD.     Trachea: No tracheal deviation.  Cardiovascular:     Rate and Rhythm: Normal rate and regular rhythm.     Heart sounds: Normal heart sounds. No murmur. No friction rub. No gallop.   Pulmonary:     Effort: Pulmonary effort is normal. No respiratory distress.     Breath sounds: Normal breath sounds. No wheezing or rales.  Chest:     Chest wall: No tenderness.     Breasts:        Right: Normal. No swelling, bleeding, inverted nipple, mass, nipple discharge, skin change or tenderness.  Left: Normal. No swelling, bleeding, inverted nipple, mass, nipple discharge, skin change or tenderness.     Comments: Exam Chaperoned by Noreene Filbert CMA Abdominal:     Palpations: Abdomen is soft.     Tenderness: There is no abdominal tenderness. There is no guarding.  Musculoskeletal: Normal range of motion.  Lymphadenopathy:     Cervical: No cervical adenopathy.  Skin:    General: Skin is warm and dry.  Neurological:     Mental Status: She is alert and oriented to person, place, and time.     Cranial Nerves: No cranial nerve deficit.  Psychiatric:         Behavior: Behavior normal.        Thought Content: Thought content normal.        Judgment: Judgment normal.    LABS: Recent Results (from the past 2160 hour(s))  UA/M w/rflx Culture, Routine     Status: Abnormal   Collection Time: 10/06/18  8:53 AM  Result Value Ref Range   Specific Gravity, UA 1.018 1.005 - 1.030   pH, UA 7.0 5.0 - 7.5   Color, UA Yellow Yellow   Appearance Ur Clear Clear   Leukocytes,UA 2+ (A) Negative   Protein,UA Negative Negative/Trace   Glucose, UA Negative Negative   Ketones, UA Negative Negative   RBC, UA Negative Negative   Bilirubin, UA Negative Negative   Urobilinogen, Ur 0.2 0.2 - 1.0 mg/dL   Nitrite, UA Negative Negative   Microscopic Examination See below:     Comment: Microscopic was indicated and was performed.   Urinalysis Reflex Comment     Comment: This specimen has reflexed to a Urine Culture.  Microscopic Examination     Status: Abnormal   Collection Time: 10/06/18  8:53 AM  Result Value Ref Range   WBC, UA 11-30 (A) 0 - 5 /hpf   RBC 0-2 0 - 2 /hpf   Epithelial Cells (non renal) >10 (A) 0 - 10 /hpf   Casts None seen None seen /lpf   Mucus, UA Present Not Estab.   Bacteria, UA Few None seen/Few  Urine Culture, Reflex     Status: Abnormal   Collection Time: 10/06/18  8:53 AM  Result Value Ref Range   Urine Culture, Routine Final report (A)    Organism ID, Bacteria Enterococcus faecalis (A)     Comment: 50,000-100,000 colony forming units per mL   ORGANISM ID, BACTERIA Comment     Comment: Mixed urogenital flora 10,000-25,000 colony forming units per mL    Antimicrobial Susceptibility Comment     Comment:       ** S = Susceptible; I = Intermediate; R = Resistant **                    P = Positive; N = Negative             MICS are expressed in micrograms per mL    Antibiotic                 RSLT#1    RSLT#2    RSLT#3    RSLT#4 Ciprofloxacin                  S Levofloxacin                   S Nitrofurantoin                  S Penicillin  S Tetracycline                   R Vancomycin                     S      Assessment/Plan: 1. Encounter for general adult medical examination with abnormal findings Pt is up to date on PHM.  - CBC with Differential/Platelet - Lipid Panel With LDL/HDL Ratio - TSH - T4, free - Comprehensive metabolic panel  2. Anxiety Stable, continue present management.   3. Depression, recurrent (HCC) Stable, denies need at this time.   4. Nicotine dependence with current use Smoking cessation counseling: 1. Pt acknowledges the risks of long term smoking, she will try to quite smoking. 2. Options for different medications including nicotine products, chewing gum, patch etc, Wellbutrin and Chantix is discussed 3. Goal and date of compete cessation is discussed 4. Total time spent in smoking cessation is 15 min.  5. Dysuria - UA/M w/rflx Culture, Routine  General Counseling: [place] verbalizes understanding of[place] the findings of todays visit and agrees with plan of treatment. I have discussed any further diagnostic evaluation that may be needed or ordered today. We also reviewed her medications today. she has been encouraged to call the office with any questions or concerns that should arise related to todays visit.   Orders Placed This Encounter  Procedures  . Microscopic Examination  . Urine Culture, Reflex  . UA/M w/rflx Culture, Routine  . CBC with Differential/Platelet  . Lipid Panel With LDL/HDL Ratio  . TSH  . T4, free  . Comprehensive metabolic panel    No orders of the defined types were placed in this encounter.   Time spent: 25 Minutes   This patient was seen by Blima LedgerAdam Rakeem Colley AGNP-C in Collaboration with Dr Lyndon CodeFozia M Khan as a part of collaborative care agreement    Johnna AcostaAdam J. Verle Brillhart AGNP-C Internal Medicine

## 2018-10-07 ENCOUNTER — Ambulatory Visit: Payer: BLUE CROSS/BLUE SHIELD | Admitting: Adult Health

## 2018-10-09 LAB — UA/M W/RFLX CULTURE, ROUTINE
Bilirubin, UA: NEGATIVE
Glucose, UA: NEGATIVE
Ketones, UA: NEGATIVE
Nitrite, UA: NEGATIVE
Protein,UA: NEGATIVE
RBC, UA: NEGATIVE
Specific Gravity, UA: 1.018 (ref 1.005–1.030)
Urobilinogen, Ur: 0.2 mg/dL (ref 0.2–1.0)
pH, UA: 7 (ref 5.0–7.5)

## 2018-10-09 LAB — MICROSCOPIC EXAMINATION
Casts: NONE SEEN /lpf
Epithelial Cells (non renal): >10 /hpf — AB (ref 0–10)

## 2018-10-09 LAB — URINE CULTURE, REFLEX

## 2018-10-11 ENCOUNTER — Telehealth: Payer: Self-pay

## 2018-10-11 ENCOUNTER — Encounter: Payer: Self-pay | Admitting: Certified Nurse Midwife

## 2018-10-11 NOTE — Telephone Encounter (Signed)
-----   Message from Lyndon Code, MD sent at 10/11/2018  2:09 PM EDT ----- Urine sample seems to be not clean, can repeat if pt is symptomatic otherwise ok

## 2018-10-11 NOTE — Telephone Encounter (Signed)
Spoke with pt she not having any symptoms advised if she had any symptoms we can repeat urine call us back

## 2018-10-13 ENCOUNTER — Encounter: Payer: Self-pay | Admitting: Adult Health

## 2018-10-15 ENCOUNTER — Encounter: Payer: Self-pay | Admitting: Adult Health

## 2018-10-15 ENCOUNTER — Ambulatory Visit: Payer: BLUE CROSS/BLUE SHIELD | Admitting: Adult Health

## 2018-10-15 ENCOUNTER — Other Ambulatory Visit: Payer: Self-pay

## 2018-10-15 VITALS — BP 106/68 | HR 95 | Temp 98.8°F | Resp 16 | Ht 62.0 in | Wt 146.0 lb

## 2018-10-15 DIAGNOSIS — R110 Nausea: Secondary | ICD-10-CM | POA: Diagnosis not present

## 2018-10-15 DIAGNOSIS — R197 Diarrhea, unspecified: Secondary | ICD-10-CM | POA: Diagnosis not present

## 2018-10-15 MED ORDER — ONDANSETRON HCL 4 MG PO TABS: 4.0000 mg | ORAL_TABLET | Freq: Three times a day (TID) | ORAL | 0 refills | Status: DC | PRN

## 2018-10-15 NOTE — Progress Notes (Signed)
[facility] [address] [place], [place] [address]  Internal MEDICINE  Office Visit Note  Patient Name: Patricia Wiley  [id number]  [id number]  Date of Service: 11/23/2018  Chief Complaint  Patient presents with  . Diarrhea    started 3 days ago with diarrhea and nausea , feels like getting worse as far as feeling bad   . Nausea  . Fatigue     HPI Pt is here for a sick visit. Pt reports 3 days ago she began to feel nausea and had diarrhea overnight.  She had Ribs that she cooked at home for dinner that night.  She reports 3 episodes of diarrhea that night.  She denies vomiting or fever.  The diarrhea resolved. She does report that she took a large amount of BC powder in the past.  She has cut back drastically.  However she continues to take them as needed for her chronic back pain.  She denies blood in her stool.     Current Medication:  Outpatient Encounter Medications as of 10/15/2018  Medication Sig  . citalopram (CELEXA) 10 MG tablet TK 1 T PO QD  . ondansetron (ZOFRAN) 4 MG tablet Take 1 tablet (4 mg total) by mouth every 8 (eight) hours as needed for nausea or vomiting.   No facility-administered encounter medications on file as of 10/15/2018.       Medical History: Past Medical History:  Diagnosis Date  . Depression      Vital Signs: BP 106/68   Pulse 95   Temp 98.8 F (37.1 C)   Resp 16   Ht 5\' 2"  (1.575 m)   Wt 146 lb (66.2 kg)   SpO2 98%   BMI 26.70 kg/m    Review of Systems  Constitutional: Negative for chills, fatigue and unexpected weight change.  HENT: Negative for congestion, rhinorrhea, sneezing and sore throat.   Eyes: Negative for photophobia, pain and redness.  Respiratory: Negative for cough, chest tightness and shortness of breath.   Cardiovascular: Negative for chest pain and palpitations.  Gastrointestinal: Negative for abdominal pain, constipation, diarrhea, nausea and vomiting.  Endocrine: Negative.    Genitourinary: Negative for dysuria and frequency.  Musculoskeletal: Negative for arthralgias, back pain, joint swelling and neck pain.  Skin: Negative for rash.  Allergic/Immunologic: Negative.   Neurological: Negative for tremors and numbness.  Hematological: Negative for adenopathy. Does not bruise/bleed easily.  Psychiatric/Behavioral: Negative for behavioral problems and sleep disturbance. The patient is not nervous/anxious.     Physical Exam Vitals signs and nursing note reviewed.  Constitutional:      General: She is not in acute distress.    Appearance: She is well-developed. She is not diaphoretic.  HENT:     Head: Normocephalic and atraumatic.     Mouth/Throat:     Pharynx: No oropharyngeal exudate.  Eyes:     Pupils: Pupils are equal, round, and reactive to light.  Neck:     Musculoskeletal: Normal range of motion and neck supple.     Thyroid: No thyromegaly.     Vascular: No JVD.     Trachea: No tracheal deviation.  Cardiovascular:     Rate and Rhythm: Normal rate and regular rhythm.     Heart sounds: Normal heart sounds. No murmur. No friction rub. No gallop.   Pulmonary:     Effort: Pulmonary effort is normal. No respiratory distress.     Breath sounds: Normal breath sounds. No wheezing or rales.  Chest:  Chest wall: No tenderness.  Abdominal:     Palpations: Abdomen is soft.     Tenderness: There is no abdominal tenderness. There is no guarding.  Musculoskeletal: Normal range of motion.  Lymphadenopathy:     Cervical: No cervical adenopathy.  Skin:    General: Skin is warm and dry.  Neurological:     Mental Status: She is alert and oriented to person, place, and time.     Cranial Nerves: No cranial nerve deficit.  Psychiatric:        Behavior: Behavior normal.        Thought Content: Thought content normal.        Judgment: Judgment normal.     Assessment/Plan: 1. Diarrhea, unspecified type Pt will take Pepto-bismol for any further stomach  irritation or diarrhea.  IF not improvement, will return to clinic.  2. Nausea Take zofran 20 minutes before attempting to eat to combat nausea.  Again, if symptoms continue, return to clinic. - ondansetron (ZOFRAN) 4 MG tablet; Take 1 tablet (4 mg total) by mouth every 8 (eight) hours as needed for nausea or vomiting.  Dispense: 6 tablet; Refill: 0  General Counseling: [name] verbalizes understanding of the findings of todays visit and agrees with plan of treatment. I have discussed any further diagnostic evaluation that may be needed or ordered today. We also reviewed her medications today. she has been encouraged to call the office with any questions or concerns that should arise related to todays visit.   No orders of the defined types were placed in this encounter.   Meds ordered this encounter  Medications  . ondansetron (ZOFRAN) 4 MG tablet    Sig: Take 1 tablet (4 mg total) by mouth every 8 (eight) hours as needed for nausea or vomiting.    Dispense:  6 tablet    Refill:  0    Time spent: 25 Minutes  This patient was seen by Blima Ledger AGNP-C in Collaboration with Dr Lyndon Code as a part of collaborative care agreement.  Johnna Acosta AGNP-C Internal Medicine

## 2018-11-26 ENCOUNTER — Telehealth: Payer: Self-pay | Admitting: *Deleted

## 2018-11-26 NOTE — Telephone Encounter (Signed)
Coronavirus (COVID-19) Are you at risk?  Are you at risk for the Coronavirus (COVID-19)?  To be considered HIGH RISK for Coronavirus (COVID-19), you have to meet the following criteria:  . Traveled to [place], [place], [place], [place] or [place]; or in the [place] to [place], [place], [place], or [place]; and have fever, cough, and shortness of breath within the last 2 weeks of travel OR . Been in close contact with a person diagnosed with COVID-19 within the last 2 weeks and have fever, cough, and shortness of breath . IF YOU DO NOT MEET THESE CRITERIA, YOU ARE CONSIDERED LOW RISK FOR COVID-19.  What to do if you are HIGH RISK for COVID-19?  . If you are having a medical emergency, call 911. . Seek medical care right away. Before you go to a doctor's office, urgent care or emergency department, call ahead and tell them about your recent travel, contact with someone diagnosed with COVID-19, and your symptoms. You should receive instructions from your physician's office regarding next steps of care.  . When you arrive at healthcare provider, tell the healthcare staff immediately you have returned from visiting [place], [place], [place], [place] or [place]; or traveled in the [place] to [place], [place], [place], or [place]; in the last two weeks or you have been in close contact with a person diagnosed with COVID-19 in the last 2 weeks.   . Tell the health care staff about your symptoms: fever, cough and shortness of breath. . After you have been seen by a medical provider, you will be either: o Tested for (COVID-19) and discharged home on quarantine except to seek medical care if symptoms worsen, and asked to  - Stay home and avoid contact with others until you get your results (4-5 days)  - Avoid travel on public transportation if possible (such as bus, train, or airplane) or o Sent to the Emergency Department by EMS for evaluation, COVID-19 testing, and possible  admission depending on your condition and test results.  What to do if you are LOW RISK for COVID-19?  Reduce your risk of any infection by using the same precautions used for avoiding the common cold or flu:  . Wash your hands often with soap and warm water for at least 20 seconds.  If soap and water are not readily available, use an alcohol-based hand sanitizer with at least 60% alcohol.  . If coughing or sneezing, cover your mouth and nose by coughing or sneezing into the elbow areas of your shirt or coat, into a tissue or into your sleeve (not your hands). . Avoid shaking hands with others and consider head nods or verbal greetings only. . Avoid touching your eyes, nose, or mouth with unwashed hands.  . Avoid close contact with people who are sick. . Avoid places or events with large numbers of people in one location, like concerts or sporting events. . Carefully consider travel plans you have or are making. . If you are planning any travel outside or inside the [place], visit the CDC's Travelers' Health webpage for the latest health notices. . If you have some symptoms but not all symptoms, continue to monitor at home and seek medical attention if your symptoms worsen. . If you are having a medical emergency, call 911.   ADDITIONAL HEALTHCARE OPTIONS FOR PATIENTS  [facility] / e-Visit: [email or web address]         [facility]: [phone]  [facility]: [phone]                   [facility]: [phone]   Spoke with pt denies any sx.  Dayanira Giovannetti, CMA 

## 2018-11-29 ENCOUNTER — Other Ambulatory Visit: Payer: Self-pay

## 2018-11-29 ENCOUNTER — Encounter: Payer: Self-pay | Admitting: Certified Nurse Midwife

## 2018-11-29 ENCOUNTER — Ambulatory Visit: Payer: BLUE CROSS/BLUE SHIELD | Admitting: Certified Nurse Midwife

## 2018-11-29 ENCOUNTER — Other Ambulatory Visit (HOSPITAL_COMMUNITY)
Admission: RE | Admit: 2018-11-29 | Discharge: 2018-11-29 | Disposition: A | Discharge status: Home / Self Care | Payer: BLUE CROSS/BLUE SHIELD | Source: Ambulatory Visit | Attending: Certified Nurse Midwife | Admitting: Certified Nurse Midwife

## 2018-11-29 VITALS — BP 120/73 | HR 89 | Ht 62.0 in | Wt 153.1 lb

## 2018-11-29 DIAGNOSIS — N920 Excessive and frequent menstruation with regular cycle: Secondary | ICD-10-CM | POA: Diagnosis not present

## 2018-11-29 DIAGNOSIS — Z124 Encounter for screening for malignant neoplasm of cervix: Secondary | ICD-10-CM | POA: Diagnosis not present

## 2018-11-29 NOTE — Patient Instructions (Signed)
Menorrhagia Menorrhagia is when your menstrual periods are heavy or last longer than normal. Follow these instructions at home: Medicines   Take over-the-counter and prescription medicines exactly as told by your doctor. This includes iron pills.  Do not change or switch medicines without asking your doctor.  Do not take aspirin or medicines that contain aspirin 1 week before or during your period. Aspirin may make bleeding worse. General instructions  If you need to change your pad or tampon more than once every 2 hours, limit your activity until the bleeding stops.  Iron pills can cause problems when pooping (constipation). To prevent or treat pooping problems while taking prescription iron pills, your doctor may suggest that you: ? Drink enough fluid to keep your pee (urine) clear or pale yellow. ? Take over-the-counter or prescription medicines. ? Eat foods that are high in fiber. These foods include: ? Fresh fruits and vegetables. ? Whole grains. ? Beans. ? Limit foods that are high in fat and processed sugars. This includes fried and sweet foods.  Eat healthy meals and foods that are high in iron. Foods that have a lot of iron include: ? Leafy green vegetables. ? Meat. ? Liver. ? Eggs. ? Whole grain breads and cereals.  Do not try to lose weight until your heavy bleeding has stopped and you have normal amounts of iron in your blood. If you need to lose weight, work with your doctor.  Keep all follow-up visits as told by your doctor. This is important. Contact a doctor if:  You soak through a pad or tampon every 1 or 2 hours, and this happens every time you have a period.  You need to use pads and tampons at the same time because you are bleeding so much.  You are taking medicine and you: ? Feel sick to your stomach (nauseous). ? Throw up (vomit). ? Have watery poop (diarrhea).  You have other problems that may be related to the medicine you are taking. Get help  right away if:  You soak through more than a pad or tampon in 1 hour.  You pass clots bigger than 1 inch (2.5 cm) wide.  You feel short of breath.  You feel like your heart is beating too fast.  You feel dizzy or you pass out (faint).  You feel very weak or tired. Summary  Menorrhagia is when your menstrual periods are heavy or last longer than normal.  Take over-the-counter and prescription medicines exactly as told by your doctor. This includes iron pills.  Contact a doctor if you soak through more than a pad or tampon in 1 hour or are passing large clots. This information is not intended to replace advice given to you by your health care provider. Make sure you discuss any questions you have with your health care provider. Document Released: 04/08/2008 Document Revised: 07/21/2016 Document Reviewed: 07/21/2016 Elsevier Interactive Patient Education  2019 Elsevier Inc.  

## 2018-11-29 NOTE — Progress Notes (Signed)
GYN ENCOUNTER NOTE  Subjective:       Patricia Wiley is a 23 y.o. G0P0000 female is here for gynecologic evaluation of the following issues:  1. Heavy periods for the past 5 yrs. She denies feeling lightheaded or passing clots. She would also like to get a pap smear. She is sexually active with one female partner.    Gynecologic History Patient's last menstrual period was 11/13/2018 (exact date). Contraception: none Last Pap: has never had pap . Last mammogram: N/A  Obstetric History OB History  Gravida Para Term Preterm AB Living  0 0 0 0 0 0  SAB TAB Ectopic Multiple Live Births  0 0 0 0 0    Past Medical History:  Diagnosis Date  . Depression     History reviewed. No pertinent surgical history.  Current Outpatient Medications on File Prior to Visit  Medication Sig Dispense Refill  . citalopram (CELEXA) 10 MG tablet TK 1 T PO QD    . ondansetron (ZOFRAN) 4 MG tablet Take 1 tablet (4 mg total) by mouth every 8 (eight) hours as needed for nausea or vomiting. 6 tablet 0   No current facility-administered medications on file prior to visit.     No Known Allergies  Social History   Socioeconomic History  . Marital status: Single    Spouse name: Not on file  . Number of children: Not on file  . Years of education: Not on file  . Highest education level: Not on file  Occupational History  . Not on file  Social Needs  . Financial resource strain: Not on file  . Food insecurity:    Worry: Not on file    Inability: Not on file  . Transportation needs:    Medical: Not on file    Non-medical: Not on file  Tobacco Use  . Smoking status: Current Every Day Smoker    Packs/day: 1.00    Types: Cigarettes  . Smokeless tobacco: Never Used  Substance and Sexual Activity  . Alcohol use: Yes    Comment: occasionaly  . Drug use: No  . Sexual activity: Yes    Birth control/protection: None  Lifestyle  . Physical activity:    Days per week: Not on file    Minutes  per session: Not on file  . Stress: Not on file  Relationships  . Social connections:    Talks on phone: Not on file    Gets together: Not on file    Attends religious service: Not on file    Active member of club or organization: Not on file    Attends meetings of clubs or organizations: Not on file    Relationship status: Not on file  . Intimate partner violence:    Fear of current or ex partner: Not on file    Emotionally abused: Not on file    Physically abused: Not on file    Forced sexual activity: Not on file  Other Topics Concern  . Not on file  Social History Narrative  . Not on file    Family History  Problem Relation Age of Onset  . Heart disease Mother     The following portions of the patient's history were reviewed and updated as appropriate: allergies, current medications, past family history, past medical history, past social history, past surgical history and problem list.  Review of Systems Review of Systems - Negative except as mentioned in hpi Review of Systems - General ROS: negative for -  chills, fatigue, fever, hot flashes, malaise or night sweats Hematological and Lymphatic ROS: negative for - bleeding problems or swollen lymph nodes Gastrointestinal ROS: negative for - abdominal pain, blood in stools, change in bowel habits and nausea/vomiting Musculoskeletal ROS: negative for - joint pain, muscle pain or muscular weakness Genito-Urinary ROS: negative for - change in menstrual cycle, dysmenorrhea, dyspareunia, dysuria, genital discharge, genital ulcers, hematuria, incontinence, irregular menses, nocturia or pelvic pain. Positive for heavy periods   Objective:   BP 120/73   Pulse 89   Ht 5\' 2"  (1.575 m)   Wt 153 lb 2 oz (69.5 kg)   LMP 11/13/2018 (Exact Date)   BMI 28.01 kg/m  CONSTITUTIONAL: Well-developed, well-nourished female in no acute distress.  HENT:  Normocephalic, atraumatic.  NECK: Normal range of motion, supple  SKIN: Skin is warm  and dry. No rash noted. Not diaphoretic. No erythema. No pallor. NEUROLGIC: Alert and oriented to person, place, and time.  PSYCHIATRIC: Normal mood and affect. Normal behavior. Normal judgment and thought content. CARDIOVASCULAR:Not Examined RESPIRATORY: Not Examined BREASTS: Not Examined ABDOMEN: Soft, non distended; Non tender.  No Organomegaly. PELVIC:  External Genitalia: Normal  BUS: Normal  Vagina: Normal  Cervix: Normal, contact bleeding with pap smear   Uterus: Normal size, shape,consistency, mobile  Adnexa: Normal  RV: Normal   Bladder: Nontender MUSCULOSKELETAL: Normal range of motion. No tenderness.  No cyanosis, clubbing, or edema.    Assessment:   Menorrhagia Pap Smear   Plan:  Discussed heavy bleeding potential causes including fibroids and endometriosis. Pamphlets given. Discussed u/s and treatment options. Reviewed birth control to help with bleeding. She state she was [facility] in the past and had to many side effects. She will do some research and decided if she would like to have u/s. Discussed use of Motrin 5-7 days to thin linning of uterus, thus decreasing bleeding. Discussed STD testing. She states she will have blood work done at PCP . She is fine with having STD testing with pap. Will follow up with results.   Doreene BurkeAnnie Ankit Degregorio, CNM

## 2018-12-03 LAB — CYTOLOGY - PAP
Chlamydia: NEGATIVE
Diagnosis: NEGATIVE
Neisseria Gonorrhea: NEGATIVE
Trichomonas: NEGATIVE

## 2018-12-05 ENCOUNTER — Other Ambulatory Visit: Payer: Self-pay | Admitting: Certified Nurse Midwife

## 2018-12-05 MED ORDER — METRONIDAZOLE 500 MG PO TABS: 500.0000 mg | ORAL_TABLET | Freq: Two times a day (BID) | ORAL | 0 refills | Status: AC

## 2018-12-05 NOTE — Progress Notes (Signed)
Pap smear shows BV present order placed for treatment.   Doreene Burke, CNM

## 2018-12-09 ENCOUNTER — Encounter: Payer: Self-pay | Admitting: Adult Health

## 2018-12-09 ENCOUNTER — Ambulatory Visit: Payer: BLUE CROSS/BLUE SHIELD | Admitting: Adult Health

## 2018-12-09 ENCOUNTER — Other Ambulatory Visit: Payer: Self-pay

## 2018-12-09 VITALS — BP 98/72 | HR 90 | Resp 16 | Ht 62.0 in | Wt 152.0 lb

## 2018-12-09 DIAGNOSIS — K2900 Acute gastritis without bleeding: Secondary | ICD-10-CM

## 2018-12-09 DIAGNOSIS — F339 Major depressive disorder, recurrent, unspecified: Secondary | ICD-10-CM

## 2018-12-09 DIAGNOSIS — F17200 Nicotine dependence, unspecified, uncomplicated: Secondary | ICD-10-CM | POA: Diagnosis not present

## 2018-12-09 MED ORDER — OMEPRAZOLE 40 MG PO CPDR: 40.0000 mg | DELAYED_RELEASE_CAPSULE | Freq: Every day | ORAL | 3 refills | Status: DC

## 2018-12-09 NOTE — Progress Notes (Signed)
[facility] [address] [place], [place] [address]  Internal MEDICINE  Office Visit Note  Patient Name: Patricia Wiley  [id number]  [id number]  Date of Service: 12/09/2018  Chief Complaint  Patient presents with  . Anxiety  . Bloated    stomach bloated, painful after eating , tight    HPI  Pt here for follow up on labs from physical.  Pt doing well overall.  She reports she has recently noticed that her stomach feels tight and bloated after eating. She reports a history gastritis from taking BC powder, that presented this way initially.     Current Medication: Outpatient Encounter Medications as of 12/09/2018  Medication Sig  . citalopram (CELEXA) 10 MG tablet TK 1 T PO QD  . metroNIDAZOLE (FLAGYL) 500 MG tablet Take 1 tablet (500 mg total) by mouth 2 (two) times daily for 7 days.  . [DISCONTINUED] ondansetron (ZOFRAN) 4 MG tablet Take 1 tablet (4 mg total) by mouth every 8 (eight) hours as needed for nausea or vomiting. (Patient not taking: Reported on 12/09/2018)   No facility-administered encounter medications on file as of 12/09/2018.     Surgical History: History reviewed. No pertinent surgical history.  Medical History: Past Medical History:  Diagnosis Date  . Depression     Family History: Family History  Problem Relation Age of Onset  . Heart disease Mother     Social History   Socioeconomic History  . Marital status: Single    Spouse name: Not on file  . Number of children: Not on file  . Years of education: Not on file  . Highest education level: Not on file  Occupational History  . Not on file  Social Needs  . Financial resource strain: Not on file  . Food insecurity:    Worry: Not on file    Inability: Not on file  . Transportation needs:    Medical: Not on file    Non-medical: Not on file  Tobacco Use  . Smoking status: Current Every Day Smoker    Packs/day: 1.00    Types: Cigarettes  . Smokeless tobacco: Never Used   Substance and Sexual Activity  . Alcohol use: Yes    Comment: occasionaly  . Drug use: No  . Sexual activity: Yes    Birth control/protection: None  Lifestyle  . Physical activity:    Days per week: Not on file    Minutes per session: Not on file  . Stress: Not on file  Relationships  . Social connections:    Talks on phone: Not on file    Gets together: Not on file    Attends religious service: Not on file    Active member of club or organization: Not on file    Attends meetings of clubs or organizations: Not on file    Relationship status: Not on file  . Intimate partner violence:    Fear of current or ex partner: Not on file    Emotionally abused: Not on file    Physically abused: Not on file    Forced sexual activity: Not on file  Other Topics Concern  . Not on file  Social History Narrative  . Not on file      Review of Systems  Constitutional: Negative for chills, fatigue and unexpected weight change.  HENT: Negative for congestion, rhinorrhea, sneezing and sore throat.   Eyes: Negative for photophobia, pain and redness.  Respiratory: Negative for cough, chest tightness and shortness  of breath.   Cardiovascular: Negative for chest pain and palpitations.  Gastrointestinal: Negative for abdominal pain, constipation, diarrhea, nausea and vomiting.  Endocrine: Negative.   Genitourinary: Negative for dysuria and frequency.  Musculoskeletal: Negative for arthralgias, back pain, joint swelling and neck pain.  Skin: Negative for rash.  Allergic/Immunologic: Negative.   Neurological: Negative for tremors and numbness.  Hematological: Negative for adenopathy. Does not bruise/bleed easily.  Psychiatric/Behavioral: Negative for behavioral problems and sleep disturbance. The patient is not nervous/anxious.     Vital Signs: BP 98/72   Pulse 90   Resp 16   Ht  (1.575 m)   Wt 152 lb (68.9 kg)   LMP 11/13/2018 (Exact Date)   SpO2 98%   BMI 27.80 kg/m     Physical Exam Vitals signs and nursing note reviewed.  Constitutional:      General: She is not in acute distress.    Appearance: She is well-developed. She is not diaphoretic.  HENT:     Head: Normocephalic and atraumatic.     Mouth/Throat:     Pharynx: No oropharyngeal exudate.  Eyes:     Pupils: Pupils are equal, round, and reactive to light.  Neck:     Musculoskeletal: Normal range of motion and neck supple.     Thyroid: No thyromegaly.     Vascular: No JVD.     Trachea: No tracheal deviation.  Cardiovascular:     Rate and Rhythm: Normal rate and regular rhythm.     Heart sounds: Normal heart sounds. No murmur. No friction rub. No gallop.   Pulmonary:     Effort: Pulmonary effort is normal. No respiratory distress.     Breath sounds: Normal breath sounds. No wheezing or rales.  Chest:     Chest wall: No tenderness.  Abdominal:     Palpations: Abdomen is soft.     Tenderness: There is no abdominal tenderness. There is no guarding.  Musculoskeletal: Normal range of motion.  Lymphadenopathy:     Cervical: No cervical adenopathy.  Skin:    General: Skin is warm and dry.  Neurological:     Mental Status: She is alert and oriented to person, place, and time.     Cranial Nerves: No cranial nerve deficit.  Psychiatric:        Behavior: Behavior normal.        Thought Content: Thought content normal.        Judgment: Judgment normal.    Assessment/Plan: 1. Other acute gastritis without hemorrhage Take Prilosec as prescribed.  Discussed dietary changes. If symptoms persist, pt will follow up in office for GI referral.  We discussed having an upper GI done but the patient would like to take the Prilosec and see how she fares.  2. Nicotine dependence with current use unfortunately patient continues to smoke 1.5 PPD.  Smoking cessation counseling: 1. Pt acknowledges the risks of long term smoking, she will try to quite smoking. 2. Options for different medications  including nicotine products, chewing gum, patch etc, Wellbutrin and Chantix is discussed 3. Goal and date of compete cessation is discussed 4. Total time spent in smoking cessation is 15 min.   3. Depression, recurrent (HCC) Stable, continue to follow up with [facility].   General Counseling: [name] verbalizes understanding of the findings of todays visit and agrees with plan of treatment. I have discussed any further diagnostic evaluation that may be needed or ordered today. We also reviewed her medications today. she has been encouraged to  call the office with any questions or concerns that should arise related to todays visit.    No orders of the defined types were placed in this encounter.   No orders of the defined types were placed in this encounter.   Time spent: 20 Minutes   This patient was seen by Blima LedgerAdam Ednamae Schiano AGNP-C in Collaboration with Dr Lyndon CodeFozia M Khan as a part of collaborative care agreement     Johnna AcostaAdam J. Atsushi Yom AGNP-C Internal medicine

## 2019-02-01 ENCOUNTER — Telehealth: Payer: Self-pay

## 2019-02-01 NOTE — Telephone Encounter (Signed)
Pt called that she is having allergic reaction maybe shrimp she had last 2 days she took benadryl last night I advised she need to been seen if worse go to ED and also take benadryl but not drive

## 2019-02-03 ENCOUNTER — Ambulatory Visit: Payer: BLUE CROSS/BLUE SHIELD | Admitting: Adult Health

## 2019-02-28 ENCOUNTER — Other Ambulatory Visit: Payer: Self-pay | Admitting: Adult Health

## 2019-02-28 MED ORDER — CITALOPRAM HYDROBROMIDE 10 MG PO TABS: 10.0000 mg | ORAL_TABLET | Freq: Every day | ORAL | 0 refills | Status: DC

## 2019-03-07 ENCOUNTER — Encounter: Payer: Self-pay | Admitting: Adult Health

## 2019-03-07 ENCOUNTER — Ambulatory Visit: Payer: BC Managed Care – PPO | Admitting: Adult Health

## 2019-03-07 ENCOUNTER — Other Ambulatory Visit: Payer: Self-pay

## 2019-03-07 VITALS — BP 106/72 | HR 92 | Resp 16 | Ht 62.0 in | Wt 158.0 lb

## 2019-03-07 DIAGNOSIS — F17200 Nicotine dependence, unspecified, uncomplicated: Secondary | ICD-10-CM | POA: Diagnosis not present

## 2019-03-07 DIAGNOSIS — F339 Major depressive disorder, recurrent, unspecified: Secondary | ICD-10-CM | POA: Diagnosis not present

## 2019-03-07 DIAGNOSIS — F419 Anxiety disorder, unspecified: Secondary | ICD-10-CM | POA: Diagnosis not present

## 2019-03-07 MED ORDER — CITALOPRAM HYDROBROMIDE 10 MG PO TABS: 10.0000 mg | ORAL_TABLET | Freq: Every day | ORAL | 3 refills | Status: DC

## 2019-03-07 NOTE — Progress Notes (Signed)
[facility] [address] [place], [place] [address]  Internal MEDICINE  Office Visit Note  Patient Name: Patricia Wiley  1995  [id number]  Date of Service: 03/07/2019  Chief Complaint  Patient presents with  . Medical Management of Chronic Issues    discuss medication fills due to not being able to get into physciatrist   . Depression    HPI  PT is here for follow up. She is in need of a refill on her celexa.  She was a patient of [facility] and has had trouble getting in to see a psychiatrist.  She only takes celexa, and just needs refills at this time.  She reports good control of symptoms with 10mg  dose. Denies any further issues.    Current Medication: Outpatient Encounter Medications as of 03/07/2019  Medication Sig  . citalopram (CELEXA) 10 MG tablet Take 1 tablet (10 mg total) by mouth daily.  Marland Kitchen. omeprazole (PRILOSEC) 40 MG capsule Take 1 capsule (40 mg total) by mouth daily.  . [DISCONTINUED] citalopram (CELEXA) 10 MG tablet Take 1 tablet (10 mg total) by mouth daily.   No facility-administered encounter medications on file as of 03/07/2019.     Surgical History: History reviewed. No pertinent surgical history.  Medical History: Past Medical History:  Diagnosis Date  . Depression     Family History: Family History  Problem Relation Age of Onset  . Heart disease Mother     Social History   Socioeconomic History  . Marital status: Single    Spouse name: Not on file  . Number of children: Not on file  . Years of education: Not on file  . Highest education level: Not on file  Occupational History  . Not on file  Social Needs  . Financial resource strain: Not on file  . Food insecurity    Worry: Not on file    Inability: Not on file  . Transportation needs    Medical: Not on file    Non-medical: Not on file  Tobacco Use  . Smoking status: Current Every Day Smoker    Packs/day: 1.00    Types: Cigarettes  . Smokeless tobacco: Never  Used  Substance and Sexual Activity  . Alcohol use: Yes    Comment: occasionaly  . Drug use: No  . Sexual activity: Yes    Birth control/protection: None  Lifestyle  . Physical activity    Days per week: Not on file    Minutes per session: Not on file  . Stress: Not on file  Relationships  . Social Musicianconnections    Talks on phone: Not on file    Gets together: Not on file    Attends religious service: Not on file    Active member of club or organization: Not on file    Attends meetings of clubs or organizations: Not on file    Relationship status: Not on file  . Intimate partner violence    Fear of current or ex partner: Not on file    Emotionally abused: Not on file    Physically abused: Not on file    Forced sexual activity: Not on file  Other Topics Concern  . Not on file  Social History Narrative  . Not on file      Review of Systems  Constitutional: Negative for chills, fatigue and unexpected weight change.  HENT: Negative for congestion, rhinorrhea, sneezing and sore throat.   Eyes: Negative for photophobia, pain and redness.  Respiratory:  Negative for cough, chest tightness and shortness of breath.   Cardiovascular: Negative for chest pain and palpitations.  Gastrointestinal: Negative for abdominal pain, constipation, diarrhea, nausea and vomiting.  Endocrine: Negative.   Genitourinary: Negative for dysuria and frequency.  Musculoskeletal: Negative for arthralgias, back pain, joint swelling and neck pain.  Skin: Negative for rash.  Allergic/Immunologic: Negative.   Neurological: Negative for tremors and numbness.  Hematological: Negative for adenopathy. Does not bruise/bleed easily.  Psychiatric/Behavioral: Negative for behavioral problems and sleep disturbance. The patient is not nervous/anxious.     Vital Signs: BP 106/72   Pulse 92   Resp 16   Ht 5\' 2"  (1.575 m)   Wt 158 lb (71.7 kg)   SpO2 98%   BMI 28.90 kg/m    Physical Exam Vitals signs and  nursing note reviewed.  Constitutional:      General: She is not in acute distress.    Appearance: She is well-developed. She is not diaphoretic.  HENT:     Head: Normocephalic and atraumatic.     Mouth/Throat:     Pharynx: No oropharyngeal exudate.  Eyes:     Pupils: Pupils are equal, round, and reactive to light.  Neck:     Musculoskeletal: Normal range of motion and neck supple.     Thyroid: No thyromegaly.     Vascular: No JVD.     Trachea: No tracheal deviation.  Cardiovascular:     Rate and Rhythm: Normal rate and regular rhythm.     Heart sounds: Normal heart sounds. No murmur. No friction rub. No gallop.   Pulmonary:     Effort: Pulmonary effort is normal. No respiratory distress.     Breath sounds: Normal breath sounds. No wheezing or rales.  Chest:     Chest wall: No tenderness.  Abdominal:     Palpations: Abdomen is soft.     Tenderness: There is no abdominal tenderness. There is no guarding.  Musculoskeletal: Normal range of motion.  Lymphadenopathy:     Cervical: No cervical adenopathy.  Skin:    General: Skin is warm and dry.  Neurological:     Mental Status: She is alert and oriented to person, place, and time.     Cranial Nerves: No cranial nerve deficit.  Psychiatric:        Behavior: Behavior normal.        Thought Content: Thought content normal.        Judgment: Judgment normal.      Assessment/Plan: 1. Depression, recurrent ([place]) Renewed patients Celexa RX.  Will follow up in 6 months.   2. Anxiety Stable, continue present management.   3. Nicotine dependence with current use Smoking cessation counseling: 1. Pt acknowledges the risks of long term smoking, she will try to quite smoking. 2. Options for different medications including nicotine products, chewing gum, patch etc, Wellbutrin and Chantix is discussed 3. Goal and date of compete cessation is discussed 4. Total time spent in smoking cessation is 15 min.   General Counseling:  Patricia Wiley verbalizes understanding of the findings of todays visit and agrees with plan of treatment. I have discussed any further diagnostic evaluation that may be needed or ordered today. We also reviewed her medications today. she has been encouraged to call the office with any questions or concerns that should arise related to todays visit.    No orders of the defined types were placed in this encounter.   Meds ordered this encounter  Medications  . citalopram (CELEXA) 10  MG tablet    Sig: Take 1 tablet (10 mg total) by mouth daily.    Dispense:  90 tablet    Refill:  3    Time spent: 15 Minutes   This patient was seen by Blima LedgerAdam Nelda Luckey AGNP-C in Collaboration with Dr Lyndon CodeFozia M Khan as a part of collaborative care agreement     Patricia Wiley AGNP-C Internal medicine

## 2019-03-29 ENCOUNTER — Other Ambulatory Visit: Payer: Self-pay | Admitting: Adult Health

## 2019-04-05 ENCOUNTER — Encounter: Payer: Self-pay | Admitting: Nurse Practitioner

## 2019-04-05 ENCOUNTER — Ambulatory Visit: Payer: BC Managed Care – PPO | Admitting: Nurse Practitioner

## 2019-04-05 ENCOUNTER — Other Ambulatory Visit: Payer: Self-pay

## 2019-04-05 DIAGNOSIS — J069 Acute upper respiratory infection, unspecified: Secondary | ICD-10-CM | POA: Diagnosis not present

## 2019-04-05 MED ORDER — AZITHROMYCIN 250 MG PO TABS: ORAL_TABLET | ORAL | 0 refills | Status: DC

## 2019-04-05 NOTE — Progress Notes (Signed)
[facility] [place], [place] [address]  Internal MEDICINE  Telephone Visit  Patient Name: Patricia Wiley  [id number]  [id number]  Date of Service: 04/17/2019  I connected with the patient at 12:39pm by webcam and verified the patients identity using two identifiers.   I discussed the limitations, risks, security and privacy concerns of performing an evaluation and management service by webcam and the availability of in person appointments. I also discussed with the patient that there may be a patient responsible charge related to the service.  The patient expressed understanding and agrees to proceed.    Chief Complaint  Patient presents with  . Sore Throat  . Ear Pain  . Cough    developing a small cough  . Nausea    especially riding in a car  . Fever    feeling feverish but check temp yesterday and it was normal    The patient has been contacted via webcam for follow up visit due to concerns for spread of novel coronavirus. The patient states that she is feeling poorly. She states that her ears hurt, her throat hurts, she has a headache, and feels as though she has motion sickness. She denies fever. She states that symptoms have been going on for several days and gradually getting worse. She does not believe she has been exposed to anyone with COVID 19. She is currently at work and comes into contact with many elderly people.       Current Medication: Outpatient Encounter Medications as of 04/05/2019  Medication Sig  . azithromycin (ZITHROMAX) 250 MG tablet z-pack - take as directed for 5 days for upper respiratory infection  . citalopram (CELEXA) 10 MG tablet Take 1 tablet (10 mg total) by mouth daily.  Marland Kitchen omeprazole (PRILOSEC) 40 MG capsule Take 1 capsule (40 mg total) by mouth daily.   No facility-administered encounter medications on file as of 04/05/2019.     Surgical History: History reviewed. No pertinent surgical history.  Medical  History: Past Medical History:  Diagnosis Date  . Depression     Family History: Family History  Problem Relation Age of Onset  . Heart disease Mother     Social History   Socioeconomic History  . Marital status: Single    Spouse name: Not on file  . Number of children: Not on file  . Years of education: Not on file  . Highest education level: Not on file  Occupational History  . Not on file  Social Needs  . Financial resource strain: Not on file  . Food insecurity    Worry: Not on file    Inability: Not on file  . Transportation needs    Medical: Not on file    Non-medical: Not on file  Tobacco Use  . Smoking status: Current Every Day Smoker    Packs/day: 1.00    Types: Cigarettes  . Smokeless tobacco: Never Used  Substance and Sexual Activity  . Alcohol use: Yes    Comment: occasionaly  . Drug use: No  . Sexual activity: Yes    Birth control/protection: None  Lifestyle  . Physical activity    Days per week: Not on file    Minutes per session: Not on file  . Stress: Not on file  Relationships  . Social Herbalist on phone: Not on file    Gets together: Not on file    Attends religious service: Not on file  Active member of club or organization: Not on file    Attends meetings of clubs or organizations: Not on file    Relationship status: Not on file  . Intimate partner violence    Fear of current or ex partner: Not on file    Emotionally abused: Not on file    Physically abused: Not on file    Forced sexual activity: Not on file  Other Topics Concern  . Not on file  Social History Narrative  . Not on file      Review of Systems  Constitutional: Positive for chills, fatigue and fever.  HENT: Positive for congestion, ear pain, postnasal drip, rhinorrhea, sinus pain and sore throat. Negative for voice change.   Respiratory: Positive for cough. Negative for wheezing.   Cardiovascular: Negative for chest pain and palpitations.   Musculoskeletal: Negative.   Skin: Negative.   Neurological: Positive for headaches.  Psychiatric/Behavioral: Negative.     Vital Signs: There were no vitals taken for this visit.   Observation/Objective:   The patient is alert and oriented. She is pleasant and answers all questions appropriately. Breathing is non-labored. She is in no acute distress at this time. The patient is nasally congested and she is hoarse. She appears to feel poorly.    Assessment/Plan: 1. Acute upper respiratory infection Start z-pack. Take as directed for 5 days. Rest and increase fluids. Recommend OTC medications as needed and as indicated to help acute symptoms.  - azithromycin (ZITHROMAX) 250 MG tablet; z-pack - take as directed for 5 days for upper respiratory infection  Dispense: 6 tablet; Refill: 0  General Counseling: [name] verbalizes understanding of the findings of today's phone visit and agrees with plan of treatment. I have discussed any further diagnostic evaluation that may be needed or ordered today. We also reviewed her medications today. she has been encouraged to call the office with any questions or concerns that should arise related to todays visit.  Rest and increase fluids. Continue using OTC medication to control symptoms.   Meds ordered this encounter  Medications  . azithromycin (ZITHROMAX) 250 MG tablet    Sig: z-pack - take as directed for 5 days for upper respiratory infection    Dispense:  6 tablet    Refill:  0    Order Specific Question:   Supervising Provider    Answer:   Lyndon Code [[id number]]    Time spent: 37 Minutes    Dr Lyndon Code Internal medicine

## 2019-04-17 DIAGNOSIS — J069 Acute upper respiratory infection, unspecified: Secondary | ICD-10-CM | POA: Insufficient documentation

## 2019-05-25 ENCOUNTER — Encounter: Payer: Self-pay | Admitting: Nurse Practitioner

## 2019-06-21 DIAGNOSIS — Z03818 Encounter for observation for suspected exposure to other biological agents ruled out: Secondary | ICD-10-CM | POA: Diagnosis not present

## 2019-07-01 DIAGNOSIS — Z03818 Encounter for observation for suspected exposure to other biological agents ruled out: Secondary | ICD-10-CM | POA: Diagnosis not present

## 2019-07-01 DIAGNOSIS — Z20828 Contact with and (suspected) exposure to other viral communicable diseases: Secondary | ICD-10-CM | POA: Diagnosis not present

## 2019-07-01 DIAGNOSIS — Z7189 Other specified counseling: Secondary | ICD-10-CM | POA: Diagnosis not present

## 2019-10-05 DIAGNOSIS — Z20822 Contact with and (suspected) exposure to covid-19: Secondary | ICD-10-CM | POA: Diagnosis not present

## 2019-10-06 ENCOUNTER — Telehealth: Payer: Self-pay

## 2019-10-06 NOTE — Telephone Encounter (Signed)
CONFIRMED AND SCREENED FOR 10-10-19 OV. 

## 2019-10-06 NOTE — Telephone Encounter (Signed)
Called lmom informing patient of appointment on 10/10/2019. klh 

## 2019-10-10 ENCOUNTER — Other Ambulatory Visit: Payer: Self-pay

## 2019-10-10 ENCOUNTER — Ambulatory Visit (INDEPENDENT_AMBULATORY_CARE_PROVIDER_SITE_OTHER): Payer: BC Managed Care – PPO | Admitting: Adult Health

## 2019-10-10 ENCOUNTER — Encounter: Payer: Self-pay | Admitting: Adult Health

## 2019-10-10 VITALS — BP 119/84 | HR 84 | Temp 97.8°F | Resp 16 | Ht 62.0 in | Wt 156.0 lb

## 2019-10-10 DIAGNOSIS — Z0001 Encounter for general adult medical examination with abnormal findings: Secondary | ICD-10-CM | POA: Diagnosis not present

## 2019-10-10 DIAGNOSIS — F17200 Nicotine dependence, unspecified, uncomplicated: Secondary | ICD-10-CM

## 2019-10-10 DIAGNOSIS — K2900 Acute gastritis without bleeding: Secondary | ICD-10-CM | POA: Diagnosis not present

## 2019-10-10 DIAGNOSIS — R300 Dysuria: Secondary | ICD-10-CM | POA: Diagnosis not present

## 2019-10-10 DIAGNOSIS — F339 Major depressive disorder, recurrent, unspecified: Secondary | ICD-10-CM | POA: Diagnosis not present

## 2019-10-10 MED ORDER — OMEPRAZOLE 40 MG PO CPDR: 40.0000 mg | DELAYED_RELEASE_CAPSULE | Freq: Every day | ORAL | 3 refills | Status: DC

## 2019-10-10 NOTE — Progress Notes (Signed)
[facility] [place], [place] [address]  Internal MEDICINE  Office Visit Note  Patient Name: Patricia Wiley  [id number]  [id number]  Date of Service: 10/10/2019  Chief Complaint  Patient presents with  . Annual Exam    acid reflux  . Depression     HPI Pt is here for routine health maintenance examination. She is a healthy 23 year old female. She has a history of gastritis a few years ago after taking BC powder very frequently. She continues to have issues with acid reflux intermittently.  She reports the symptoms resolve when she takes PPI regularly.  She denies any new or worsening symptoms.  She has a history of depression that has been well controlled on celexa at this time. She denies any complaints currently.   Current Medication: Outpatient Encounter Medications as of 10/10/2019  Medication Sig  . azithromycin (ZITHROMAX) 250 MG tablet z-pack - take as directed for 5 days for upper respiratory infection (Patient not taking: Reported on 10/10/2019)  . citalopram (CELEXA) 10 MG tablet Take 1 tablet (10 mg total) by mouth daily. (Patient not taking: Reported on 10/10/2019)  . omeprazole (PRILOSEC) 40 MG capsule Take 1 capsule (40 mg total) by mouth daily. (Patient not taking: Reported on 10/10/2019)   No facility-administered encounter medications on file as of 10/10/2019.    Surgical History: History reviewed. No pertinent surgical history.  Medical History: Past Medical History:  Diagnosis Date  . Depression     Family History: Family History  Problem Relation Age of Onset  . Heart disease Mother       Review of Systems  Constitutional: Negative for chills, fatigue and unexpected weight change.  HENT: Negative for congestion, rhinorrhea, sneezing and sore throat.   Eyes: Negative for photophobia, pain and redness.  Respiratory: Negative for cough, chest tightness and shortness of breath.   Cardiovascular: Negative for chest pain and  palpitations.  Gastrointestinal: Negative for abdominal pain, constipation, diarrhea, nausea and vomiting.  Endocrine: Negative.   Genitourinary: Negative for dysuria and frequency.  Musculoskeletal: Negative for arthralgias, back pain, joint swelling and neck pain.  Skin: Negative for rash.  Allergic/Immunologic: Negative.   Neurological: Negative for tremors and numbness.  Hematological: Negative for adenopathy. Does not bruise/bleed easily.  Psychiatric/Behavioral: Negative for behavioral problems and sleep disturbance. The patient is not nervous/anxious.      Vital Signs: BP 119/84   Pulse 84   Temp 97.8 F (36.6 C)   Resp 16   Ht 5\' 2"  (1.575 m)   Wt 156 lb (70.8 kg)   SpO2 100%   BMI 28.53 kg/m    Physical Exam Vitals and nursing note reviewed.  Constitutional:      General: She is not in acute distress.    Appearance: She is well-developed. She is not diaphoretic.  HENT:     Head: Normocephalic and atraumatic.     Mouth/Throat:     Pharynx: No oropharyngeal exudate.  Eyes:     Pupils: Pupils are equal, round, and reactive to light.  Neck:     Thyroid: No thyromegaly.     Vascular: No JVD.     Trachea: No tracheal deviation.  Cardiovascular:     Rate and Rhythm: Normal rate and regular rhythm.     Heart sounds: Normal heart sounds. No murmur. No friction rub. No gallop.   Pulmonary:     Effort: Pulmonary effort is normal. No respiratory distress.     Breath sounds:  Normal breath sounds. No wheezing or rales.  Chest:     Chest wall: No tenderness.  Abdominal:     Palpations: Abdomen is soft.     Tenderness: There is no abdominal tenderness. There is no guarding.  Musculoskeletal:        General: Normal range of motion.     Cervical back: Normal range of motion and neck supple.  Lymphadenopathy:     Cervical: No cervical adenopathy.  Skin:    General: Skin is warm and dry.  Neurological:     Mental Status: She is alert and oriented to person, place, and  time.     Cranial Nerves: No cranial nerve deficit.  Psychiatric:        Behavior: Behavior normal.        Thought Content: Thought content normal.        Judgment: Judgment normal.      LABS: No results found for this or any previous visit (from the past 2160 hour(s)).  Assessment/Plan: 1. Encounter for general adult medical examination with abnormal findings Up to date on PHM will review labs when available.  - CBC with Differential/Platelet - Lipid Panel With LDL/HDL Ratio - TSH - T4, free - Comprehensive metabolic panel  2. Depression, recurrent (HCC) Controlled, continue celexa as directed.   3. Other acute gastritis without hemorrhage Continue to use prilosec as directed.  Discussed gi referral/upper gi if symptoms continue.  - omeprazole (PRILOSEC) 40 MG capsule; Take 1 capsule (40 mg total) by mouth daily.  Dispense: 30 capsule; Refill: 3  4. Nicotine dependence with current use Smoking cessation counseling: 1. Pt acknowledges the risks of long term smoking, she will try to quite smoking. 2. Options for different medications including nicotine products, chewing gum, patch etc, Wellbutrin and Chantix is discussed 3. Goal and date of compete cessation is discussed 4. Total time spent in smoking cessation is 15 min.  5. Dysuria - Urinalysis, Routine w reflex microscopic  General Counseling: Patricia Wiley verbalizes understanding of the findings of todays visit and agrees with plan of treatment. I have discussed any further diagnostic evaluation that may be needed or ordered today. We also reviewed her medications today. she has been encouraged to call the office with any questions or concerns that should arise related to todays visit.   No orders of the defined types were placed in this encounter.   No orders of the defined types were placed in this encounter.   Time spent: 30 Minutes   This patient was seen by Blima Ledger AGNP-C in Collaboration with Dr Lyndon Code as a part of collaborative care agreement    Johnna Acosta AGNP-C Internal Medicine

## 2019-10-11 LAB — URINALYSIS, ROUTINE W REFLEX MICROSCOPIC
Bilirubin, UA: NEGATIVE
Glucose, UA: NEGATIVE
Ketones, UA: NEGATIVE
Nitrite, UA: NEGATIVE
Protein,UA: NEGATIVE
RBC, UA: NEGATIVE
Specific Gravity, UA: 1.016 (ref 1.005–1.030)
Urobilinogen, Ur: 0.2 mg/dL (ref 0.2–1.0)
pH, UA: 7.5 (ref 5.0–7.5)

## 2019-10-11 LAB — MICROSCOPIC EXAMINATION
Casts: NONE SEEN /lpf
RBC, Urine: NONE SEEN /hpf (ref 0–2)

## 2019-11-01 DIAGNOSIS — Z0001 Encounter for general adult medical examination with abnormal findings: Secondary | ICD-10-CM | POA: Diagnosis not present

## 2019-11-01 LAB — MICROSCOPIC EXAMINATION

## 2019-11-02 NOTE — Progress Notes (Signed)
Repeat urine if needed, this specimen has epithelial cell

## 2019-11-03 ENCOUNTER — Telehealth: Payer: Self-pay

## 2019-11-03 NOTE — Telephone Encounter (Signed)
Confirmed appointment on 11/07/2019 and screened for covid. klh 

## 2019-11-04 LAB — URINE CULTURE, REFLEX

## 2019-11-04 LAB — UA/M W/RFLX CULTURE, ROUTINE
Bilirubin, UA: NEGATIVE
Glucose, UA: NEGATIVE
Ketones, UA: NEGATIVE
Nitrite, UA: NEGATIVE
RBC, UA: NEGATIVE
Specific Gravity, UA: 1.026 (ref 1.005–1.030)
Urobilinogen, Ur: 0.2 mg/dL (ref 0.2–1.0)
pH, UA: 6 (ref 5.0–7.5)

## 2019-11-04 LAB — COMPREHENSIVE METABOLIC PANEL
ALT: 16 IU/L (ref 0–32)
AST: 20 IU/L (ref 0–40)
Albumin/Globulin Ratio: 2.2 (ref 1.2–2.2)
Albumin: 4.6 g/dL (ref 3.9–5.0)
Alkaline Phosphatase: 55 IU/L (ref 39–117)
BUN/Creatinine Ratio: 13 (ref 9–23)
BUN: 9 mg/dL (ref 6–20)
Bilirubin Total: 0.4 mg/dL (ref 0.0–1.2)
CO2: 22 mmol/L (ref 20–29)
Calcium: 9.6 mg/dL (ref 8.7–10.2)
Chloride: 103 mmol/L (ref 96–106)
Creatinine, Ser: 0.7 mg/dL (ref 0.57–1.00)
GFR calc Af Amer: 139 mL/min/{1.73_m2} (ref >59)
GFR calc non Af Amer: 121 mL/min/{1.73_m2} (ref >59)
Globulin, Total: 2.1 g/dL (ref 1.5–4.5)
Glucose: 86 mg/dL (ref 65–99)
Potassium: 3.9 mmol/L (ref 3.5–5.2)
Sodium: 141 mmol/L (ref 134–144)
Total Protein: 6.7 g/dL (ref 6.0–8.5)

## 2019-11-04 LAB — CBC WITH DIFFERENTIAL/PLATELET
Basophils Absolute: 0 10*3/uL (ref 0.0–0.2)
Basos: 0 %
EOS (ABSOLUTE): 0.1 10*3/uL (ref 0.0–0.4)
Eos: 2 %
Hematocrit: 44.7 % (ref 34.0–46.6)
Hemoglobin: 14.5 g/dL (ref 11.1–15.9)
Immature Grans (Abs): 0 10*3/uL (ref 0.0–0.1)
Immature Granulocytes: 0 %
Lymphocytes Absolute: 1.8 10*3/uL (ref 0.7–3.1)
Lymphs: 34 %
MCH: 29.8 pg (ref 26.6–33.0)
MCHC: 32.4 g/dL (ref 31.5–35.7)
MCV: 92 fL (ref 79–97)
Monocytes Absolute: 0.4 10*3/uL (ref 0.1–0.9)
Monocytes: 7 %
Neutrophils Absolute: 3 10*3/uL (ref 1.4–7.0)
Neutrophils: 57 %
Platelets: 340 10*3/uL (ref 150–450)
RBC: 4.86 x10E6/uL (ref 3.77–5.28)
RDW: 12 % (ref 11.7–15.4)
WBC: 5.3 10*3/uL (ref 3.4–10.8)

## 2019-11-04 LAB — T4, FREE: Free T4: 1.08 ng/dL (ref 0.82–1.77)

## 2019-11-04 LAB — MICROSCOPIC EXAMINATION
Casts: NONE SEEN /lpf
Epithelial Cells (non renal): >10 /hpf — AB (ref 0–10)

## 2019-11-04 LAB — LIPID PANEL WITH LDL/HDL RATIO
Cholesterol, Total: 161 mg/dL (ref 100–199)
HDL: 68 mg/dL (ref >39)
LDL Chol Calc (NIH): 74 mg/dL (ref 0–99)
LDL/HDL Ratio: 1.1 ratio (ref 0.0–3.2)
Triglycerides: 108 mg/dL (ref 0–149)
VLDL Cholesterol Cal: 19 mg/dL (ref 5–40)

## 2019-11-04 LAB — TSH: TSH: 1.51 u[IU]/mL (ref 0.450–4.500)

## 2019-11-07 ENCOUNTER — Ambulatory Visit: Payer: BC Managed Care – PPO | Admitting: Adult Health

## 2019-11-07 ENCOUNTER — Other Ambulatory Visit: Payer: Self-pay

## 2019-11-07 ENCOUNTER — Encounter: Payer: Self-pay | Admitting: Adult Health

## 2019-11-07 VITALS — BP 116/80 | HR 100 | Temp 98.0°F | Resp 16 | Ht 62.0 in | Wt 158.0 lb

## 2019-11-07 DIAGNOSIS — F17200 Nicotine dependence, unspecified, uncomplicated: Secondary | ICD-10-CM

## 2019-11-07 DIAGNOSIS — F419 Anxiety disorder, unspecified: Secondary | ICD-10-CM | POA: Diagnosis not present

## 2019-11-07 DIAGNOSIS — F339 Major depressive disorder, recurrent, unspecified: Secondary | ICD-10-CM

## 2019-11-07 MED ORDER — ALPRAZOLAM 0.25 MG PO TABS: 0.2500 mg | ORAL_TABLET | Freq: Two times a day (BID) | ORAL | 0 refills | Status: DC | PRN

## 2019-11-07 NOTE — Progress Notes (Signed)
[facility] [address] [place], [place] [address]  Internal MEDICINE  Office Visit Note  Patient Name: Patricia Wiley  [id number]  [id number]  Date of Service: 11/21/2019  Chief Complaint  Patient presents with  . Follow-up  . Depression    HPI  Pt is here for follow up on physical labs.  She has a history of depression and anxiety.  She reports her anxiety has been worse the last few days.  It has not "let up" and she feels very anxious.  She stopped celexa back in October 2020, and has been doing well until this weekend.  She does not want to go back on a daily medicaiton at this time.  We discussed PRN therapy with a low dose benzo, however if she has a continued need for xanax RX, she will need to go back on a daily SSRI.  She verbalized understanding of this.     Current Medication: Outpatient Encounter Medications as of 11/07/2019  Medication Sig  . omeprazole (PRILOSEC) 40 MG capsule Take 1 capsule (40 mg total) by mouth daily.  Marland Kitchen ALPRAZolam (XANAX) 0.25 MG tablet Take 1 tablet (0.25 mg total) by mouth 2 (two) times daily as needed. Half to one tab tab as needed for panic attacks (Patient taking differently: Take 0.25 mg by mouth 2 (two) times daily as needed. for panic attacks)  . [DISCONTINUED] azithromycin (ZITHROMAX) 250 MG tablet z-pack - take as directed for 5 days for upper respiratory infection (Patient not taking: Reported on 10/10/2019)  . [DISCONTINUED] citalopram (CELEXA) 10 MG tablet Take 1 tablet (10 mg total) by mouth daily. (Patient not taking: Reported on 10/10/2019)   No facility-administered encounter medications on file as of 11/07/2019.    Surgical History: History reviewed. No pertinent surgical history.  Medical History: Past Medical History:  Diagnosis Date  . Depression     Family History: Family History  Problem Relation Age of Onset  . Heart disease Mother     Social History   Socioeconomic History  . Marital status:  Single    Spouse name: Not on file  . Number of children: Not on file  . Years of education: Not on file  . Highest education level: Not on file  Occupational History  . Not on file  Tobacco Use  . Smoking status: Current Every Day Smoker    Packs/day: 1.00    Types: Cigarettes  . Smokeless tobacco: Never Used  Substance and Sexual Activity  . Alcohol use: Yes    Comment: occasionaly  . Drug use: No  . Sexual activity: Yes    Birth control/protection: None  Other Topics Concern  . Not on file  Social History Narrative  . Not on file   Social Determinants of Health   Financial Resource Strain:   . Difficulty of Paying Living Expenses:   Food Insecurity:   . Worried About Programme researcher, broadcasting/film/video in the Last Year:   . Barista in the Last Year:   Transportation Needs:   . Freight forwarder (Medical):   Marland Kitchen Lack of Transportation (Non-Medical):   Physical Activity:   . Days of Exercise per Week:   . Minutes of Exercise per Session:   Stress:   . Feeling of Stress :   Social Connections:   . Frequency of Communication with Friends and Family:   . Frequency of Social Gatherings with Friends and Family:   . Attends Religious Services:   .  Active Member of Clubs or Organizations:   . Attends Banker Meetings:   Marland Kitchen Marital Status:   Intimate Partner Violence:   . Fear of Current or Ex-Partner:   . Emotionally Abused:   Marland Kitchen Physically Abused:   . Sexually Abused:       Review of Systems  Constitutional: Negative for chills, fatigue and unexpected weight change.  HENT: Negative for congestion, rhinorrhea, sneezing and sore throat.   Eyes: Negative for photophobia, pain and redness.  Respiratory: Negative for cough, chest tightness and shortness of breath.   Cardiovascular: Negative for chest pain and palpitations.  Gastrointestinal: Negative for abdominal pain, constipation, diarrhea, nausea and vomiting.  Endocrine: Negative.   Genitourinary:  Negative for dysuria and frequency.  Musculoskeletal: Negative for arthralgias, back pain, joint swelling and neck pain.  Skin: Negative for rash.  Allergic/Immunologic: Negative.   Neurological: Negative for tremors and numbness.  Hematological: Negative for adenopathy. Does not bruise/bleed easily.  Psychiatric/Behavioral: Negative for behavioral problems and sleep disturbance. The patient is not nervous/anxious.     Vital Signs: BP 116/80   Pulse 100   Temp 98 F (36.7 C)   Resp 16   Ht 5\' 2"  (1.575 m)   Wt 158 lb (71.7 kg)   SpO2 97%   BMI 28.90 kg/m    Physical Exam Vitals and nursing note reviewed.  Constitutional:      General: She is not in acute distress.    Appearance: She is well-developed. She is not diaphoretic.  HENT:     Head: Normocephalic and atraumatic.     Mouth/Throat:     Pharynx: No oropharyngeal exudate.  Eyes:     Pupils: Pupils are equal, round, and reactive to light.  Neck:     Thyroid: No thyromegaly.     Vascular: No JVD.     Trachea: No tracheal deviation.  Cardiovascular:     Rate and Rhythm: Normal rate and regular rhythm.     Heart sounds: Normal heart sounds. No murmur. No friction rub. No gallop.   Pulmonary:     Effort: Pulmonary effort is normal. No respiratory distress.     Breath sounds: Normal breath sounds. No wheezing or rales.  Chest:     Chest wall: No tenderness.  Abdominal:     Palpations: Abdomen is soft.     Tenderness: There is no abdominal tenderness. There is no guarding.  Musculoskeletal:        General: Normal range of motion.     Cervical back: Normal range of motion and neck supple.  Lymphadenopathy:     Cervical: No cervical adenopathy.  Skin:    General: Skin is warm and dry.  Neurological:     Mental Status: She is alert and oriented to person, place, and time.     Cranial Nerves: No cranial nerve deficit.  Psychiatric:        Behavior: Behavior normal.        Thought Content: Thought content normal.         Judgment: Judgment normal.    Assessment/Plan: 1. Anxiety Reviewed risks and possible side effects associated with taking opiates, benzodiazepines and other CNS depressants. Combination of these could cause dizziness and drowsiness. Advised patient not to drive or operate machinery when taking these medications, as patient's and other's life can be at risk and will have consequences. Patient verbalized understanding in this matter. Dependence and abuse for these drugs will be monitored closely. A Controlled substance policy and  procedure is on file which allows [facility] to order a urine drug screen test at any visit. Patient understands and agrees with the plan - ALPRAZolam (XANAX) 0.25 MG tablet; Take 1 tablet (0.25 mg total) by mouth 2 (two) times daily as needed. Half to one tab tab as needed for panic attacks  Dispense: 10 tablet; Refill: 0  2. Depression, recurrent ([place]) Overall doing well, continue monitor.   3. Nicotine dependence with current use Smoking cessation counseling: 1. Pt acknowledges the risks of long term smoking, she will try to quite smoking. 2. Options for different medications including nicotine products, chewing gum, patch etc, Wellbutrin and Chantix is discussed 3. Goal and date of compete cessation is discussed 4. Total time spent in smoking cessation is 15 min.   General Counseling: Patricia Wiley verbalizes understanding of the findings of todays visit and agrees with plan of treatment. I have discussed any further diagnostic evaluation that may be needed or ordered today. We also reviewed her medications today. she has been encouraged to call the office with any questions or concerns that should arise related to todays visit.    No orders of the defined types were placed in this encounter.   Meds ordered this encounter  Medications  . ALPRAZolam (XANAX) 0.25 MG tablet    Sig: Take 1 tablet (0.25 mg total) by mouth 2 (two) times daily as  needed. Half to one tab tab as needed for panic attacks    Dispense:  10 tablet    Refill:  0    Time spent: 30 Minutes   This patient was seen by Orson Gear AGNP-C in Collaboration with Dr Lavera Guise as a part of collaborative care agreement     Kendell Bane AGNP-C Internal medicine

## 2019-11-08 DIAGNOSIS — Z03818 Encounter for observation for suspected exposure to other biological agents ruled out: Secondary | ICD-10-CM | POA: Diagnosis not present

## 2019-11-30 ENCOUNTER — Telehealth: Payer: Self-pay

## 2019-11-30 NOTE — Telephone Encounter (Signed)
Pt called that she had scratchy throat and cough I advised her we can make video call appt she said she will call in morning she is not feeling better and also advised her to gargle with salt water  And take OTC for cough med

## 2019-12-06 DIAGNOSIS — J329 Chronic sinusitis, unspecified: Secondary | ICD-10-CM | POA: Diagnosis not present

## 2019-12-06 DIAGNOSIS — R05 Cough: Secondary | ICD-10-CM | POA: Diagnosis not present

## 2019-12-06 DIAGNOSIS — Z03818 Encounter for observation for suspected exposure to other biological agents ruled out: Secondary | ICD-10-CM | POA: Diagnosis not present

## 2019-12-06 DIAGNOSIS — J029 Acute pharyngitis, unspecified: Secondary | ICD-10-CM | POA: Diagnosis not present

## 2019-12-29 ENCOUNTER — Telehealth: Payer: Self-pay

## 2019-12-29 NOTE — Telephone Encounter (Signed)
Confirmed appointment on 01/02/2020 and screened for covid. [facility] 

## 2020-01-02 ENCOUNTER — Other Ambulatory Visit: Payer: Self-pay

## 2020-01-02 ENCOUNTER — Encounter: Payer: Self-pay | Admitting: Adult Health

## 2020-01-02 ENCOUNTER — Ambulatory Visit: Payer: BC Managed Care – PPO | Admitting: Adult Health

## 2020-01-02 VITALS — BP 127/88 | HR 93 | Temp 97.7°F | Resp 16 | Ht 62.0 in | Wt 153.2 lb

## 2020-01-02 DIAGNOSIS — F339 Major depressive disorder, recurrent, unspecified: Secondary | ICD-10-CM

## 2020-01-02 DIAGNOSIS — F419 Anxiety disorder, unspecified: Secondary | ICD-10-CM

## 2020-01-02 DIAGNOSIS — F17200 Nicotine dependence, unspecified, uncomplicated: Secondary | ICD-10-CM

## 2020-01-02 NOTE — Progress Notes (Signed)
[facility] [address] [place], [place] [address]  Internal MEDICINE  Office Visit Note  Patient Name: Patricia Wiley  [id number]  [id number]  Date of Service: 01/02/2020  Chief Complaint  Patient presents with  . Follow-up  . Depression    HPI Pt is here for follow up on depression.  At last visit she was given Xanax for PRN  Use.  She reports having used one tablet since then.  She reports her depression is still relevant, in that she is trying to lose weight, and is having difficulty doing so.  It makes her depressed that she can not lose weight. She does not wise to go back on daily medication for depression at this time.  She recently stated a new job, and she is hopeful this will help because she will be up moving around more.     Current Medication: Outpatient Encounter Medications as of 01/02/2020  Medication Sig  . ALPRAZolam (XANAX) 0.25 MG tablet Take 1 tablet (0.25 mg total) by mouth 2 (two) times daily as needed. Half to one tab tab as needed for panic attacks (Patient taking differently: Take 0.25 mg by mouth 2 (two) times daily as needed. for panic attacks)  . omeprazole (PRILOSEC) 40 MG capsule Take 1 capsule (40 mg total) by mouth daily.   No facility-administered encounter medications on file as of 01/02/2020.    Surgical History: History reviewed. No pertinent surgical history.  Medical History: Past Medical History:  Diagnosis Date  . Depression     Family History: Family History  Problem Relation Age of Onset  . Heart disease Mother     Social History   Socioeconomic History  . Marital status: Single    Spouse name: Not on file  . Number of children: Not on file  . Years of education: Not on file  . Highest education level: Not on file  Occupational History  . Not on file  Tobacco Use  . Smoking status: Current Every Day Smoker    Packs/day: 1.00    Types: Cigarettes  . Smokeless tobacco: Never Used  Vaping Use  .  Vaping Use: Never used  Substance and Sexual Activity  . Alcohol use: Yes    Comment: occasionaly  . Drug use: No  . Sexual activity: Yes    Birth control/protection: None  Other Topics Concern  . Not on file  Social History Narrative  . Not on file   Social Determinants of Health   Financial Resource Strain:   . Difficulty of Paying Living Expenses:   Food Insecurity:   . Worried About Programme researcher, broadcasting/film/video in the Last Year:   . Barista in the Last Year:   Transportation Needs:   . Freight forwarder (Medical):   Marland Kitchen Lack of Transportation (Non-Medical):   Physical Activity:   . Days of Exercise per Week:   . Minutes of Exercise per Session:   Stress:   . Feeling of Stress :   Social Connections:   . Frequency of Communication with Friends and Family:   . Frequency of Social Gatherings with Friends and Family:   . Attends Religious Services:   . Active Member of Clubs or Organizations:   . Attends Banker Meetings:   Marland Kitchen Marital Status:   Intimate Partner Violence:   . Fear of Current or Ex-Partner:   . Emotionally Abused:   Marland Kitchen Physically Abused:   . Sexually Abused:  Review of Systems  Constitutional: Negative for chills, fatigue and unexpected weight change.  HENT: Negative for congestion, rhinorrhea, sneezing and sore throat.   Eyes: Negative for photophobia, pain and redness.  Respiratory: Negative for cough, chest tightness and shortness of breath.   Cardiovascular: Negative for chest pain and palpitations.  Gastrointestinal: Negative for abdominal pain, constipation, diarrhea, nausea and vomiting.  Endocrine: Negative.   Genitourinary: Negative for dysuria and frequency.  Musculoskeletal: Negative for arthralgias, back pain, joint swelling and neck pain.  Skin: Negative for rash.  Allergic/Immunologic: Negative.   Neurological: Negative for tremors and numbness.  Hematological: Negative for adenopathy. Does not bruise/bleed easily.   Psychiatric/Behavioral: Negative for behavioral problems and sleep disturbance. The patient is not nervous/anxious.     Vital Signs: BP 127/88   Pulse 93   Temp 97.7 F (36.5 C)   Resp 16   Ht 5\' 2"  (1.575 m)   Wt 153 lb 3.2 oz (69.5 kg)   SpO2 98%   BMI 28.02 kg/m    Physical Exam Vitals and nursing note reviewed.  Constitutional:      General: She is not in acute distress.    Appearance: She is well-developed. She is not diaphoretic.  HENT:     Head: Normocephalic and atraumatic.     Mouth/Throat:     Pharynx: No oropharyngeal exudate.  Eyes:     Pupils: Pupils are equal, round, and reactive to light.  Neck:     Thyroid: No thyromegaly.     Vascular: No JVD.     Trachea: No tracheal deviation.  Cardiovascular:     Rate and Rhythm: Normal rate and regular rhythm.     Heart sounds: Normal heart sounds. No murmur heard.  No friction rub. No gallop.   Pulmonary:     Effort: Pulmonary effort is normal. No respiratory distress.     Breath sounds: Normal breath sounds. No wheezing or rales.  Chest:     Chest wall: No tenderness.  Abdominal:     Palpations: Abdomen is soft.     Tenderness: There is no abdominal tenderness. There is no guarding.  Musculoskeletal:        General: Normal range of motion.     Cervical back: Normal range of motion and neck supple.  Lymphadenopathy:     Cervical: No cervical adenopathy.  Skin:    General: Skin is warm and dry.  Neurological:     Mental Status: She is alert and oriented to person, place, and time.     Cranial Nerves: No cranial nerve deficit.  Psychiatric:        Behavior: Behavior normal.        Thought Content: Thought content normal.        Judgment: Judgment normal.    Assessment/Plan: 1. Anxiety Doing well, no new issues.  Continue to monitor.   2. Depression, recurrent ([facility]) Overall doing well.  Continues to have intermittent issues.  Discussed trying Wellbutrin, she will let know.   3. Nicotine  dependence with current use Smoking cessation counseling: 1. Pt acknowledges the risks of long term smoking, she will try to quite smoking. 2. Options for different medications including nicotine products, chewing gum, patch etc, Wellbutrin and Chantix is discussed 3. Goal and date of compete cessation is discussed 4. Total time spent in smoking cessation is 15 min.   General Counseling: [place] verbalizes understanding of the findings of todays visit and agrees with plan of treatment. I have discussed any  further diagnostic evaluation that may be needed or ordered today. We also reviewed her medications today. she has been encouraged to call the office with any questions or concerns that should arise related to todays visit.    No orders of the defined types were placed in this encounter.   No orders of the defined types were placed in this encounter.   Time spent: 30 Minutes   This patient was seen by Orson Gear AGNP-C in Collaboration with Dr Lavera Guise as a part of collaborative care agreement     Kendell Bane AGNP-C Internal medicine

## 2020-02-23 ENCOUNTER — Telehealth: Payer: Self-pay

## 2020-02-23 NOTE — Telephone Encounter (Signed)
LMOM for office visit on 8/16 °

## 2020-02-27 ENCOUNTER — Ambulatory Visit: Payer: BC Managed Care – PPO | Admitting: Adult Health

## 2020-02-27 ENCOUNTER — Other Ambulatory Visit: Payer: Self-pay

## 2020-02-27 ENCOUNTER — Encounter: Payer: Self-pay | Admitting: Adult Health

## 2020-02-27 VITALS — BP 127/88 | HR 91 | Temp 98.2°F | Resp 16 | Ht 62.0 in | Wt 150.6 lb

## 2020-02-27 DIAGNOSIS — F17200 Nicotine dependence, unspecified, uncomplicated: Secondary | ICD-10-CM | POA: Diagnosis not present

## 2020-02-27 DIAGNOSIS — F339 Major depressive disorder, recurrent, unspecified: Secondary | ICD-10-CM | POA: Diagnosis not present

## 2020-02-27 DIAGNOSIS — F419 Anxiety disorder, unspecified: Secondary | ICD-10-CM | POA: Diagnosis not present

## 2020-02-27 LAB — POCT URINE DRUG SCREEN
Methylenedioxyamphetamine: NOT DETECTED
POC Amphetamine UR: NOT DETECTED
POC BENZODIAZEPINES UR: NOT DETECTED
POC Barbiturate UR: NOT DETECTED
POC Cocaine UR: NOT DETECTED
POC Ecstasy UR: NOT DETECTED
POC Marijuana UR: NOT DETECTED
POC Methadone UR: NOT DETECTED
POC Methamphetamine UR: NOT DETECTED
POC Opiate Ur: NOT DETECTED
POC Oxycodone UR: NOT DETECTED
POC PHENCYCLIDINE UR: NOT DETECTED
POC TRICYCLICS UR: NOT DETECTED

## 2020-02-27 NOTE — Progress Notes (Signed)
[facility] [address] [place], [place] [address]  Internal MEDICINE  Office Visit Note  Patient Name: Patricia Wiley  [id number]  [id number]  Date of Service: 03/25/2020  Chief Complaint  Patient presents with  . Follow-up  . Depression  . Quality Metric Gaps    HepC, HIV screening, TDAP    HPI  Pt is here for follow up on depression. She continues to do well. Denies and new or worsening symptoms.     Current Medication: Outpatient Encounter Medications as of 02/27/2020  Medication Sig  . ALPRAZolam (XANAX) 0.25 MG tablet Take 1 tablet (0.25 mg total) by mouth 2 (two) times daily as needed. Half to one tab tab as needed for panic attacks  . [DISCONTINUED] omeprazole (PRILOSEC) 40 MG capsule Take 1 capsule (40 mg total) by mouth daily.   No facility-administered encounter medications on file as of 02/27/2020.    Surgical History: History reviewed. No pertinent surgical history.  Medical History: Past Medical History:  Diagnosis Date  . Depression     Family History: Family History  Problem Relation Age of Onset  . Heart disease Mother     Social History   Socioeconomic History  . Marital status: Single    Spouse name: Not on file  . Number of children: Not on file  . Years of education: Not on file  . Highest education level: Not on file  Occupational History  . Not on file  Tobacco Use  . Smoking status: Current Every Day Smoker    Packs/day: 1.00    Types: Cigarettes  . Smokeless tobacco: Never Used  Vaping Use  . Vaping Use: Never used  Substance and Sexual Activity  . Alcohol use: Yes    Comment: occasionaly  . Drug use: No  . Sexual activity: Yes    Birth control/protection: None  Other Topics Concern  . Not on file  Social History Narrative  . Not on file   Social Determinants of Health   Financial Resource Strain:   . Difficulty of Paying Living Expenses: Not on file  Food Insecurity:   . Worried About Patent examiner in the Last Year: Not on file  . Ran Out of Food in the Last Year: Not on file  Transportation Needs:   . Lack of Transportation (Medical): Not on file  . Lack of Transportation (Non-Medical): Not on file  Physical Activity:   . Days of Exercise per Week: Not on file  . Minutes of Exercise per Session: Not on file  Stress:   . Feeling of Stress : Not on file  Social Connections:   . Frequency of Communication with Friends and Family: Not on file  . Frequency of Social Gatherings with Friends and Family: Not on file  . Attends Religious Services: Not on file  . Active Member of Clubs or Organizations: Not on file  . Attends Banker Meetings: Not on file  . Marital Status: Not on file  Intimate Partner Violence:   . Fear of Current or Ex-Partner: Not on file  . Emotionally Abused: Not on file  . Physically Abused: Not on file  . Sexually Abused: Not on file      Review of Systems  Constitutional: Negative for chills, fatigue and unexpected weight change.  HENT: Negative for congestion, rhinorrhea, sneezing and sore throat.   Eyes: Negative for photophobia, pain and redness.  Respiratory: Negative for cough, chest tightness and shortness of breath.  Cardiovascular: Negative for chest pain and palpitations.  Gastrointestinal: Negative for abdominal pain, constipation, diarrhea, nausea and vomiting.  Endocrine: Negative.   Genitourinary: Negative for dysuria and frequency.  Musculoskeletal: Negative for arthralgias, back pain, joint swelling and neck pain.  Skin: Negative for rash.  Allergic/Immunologic: Negative.   Neurological: Negative for tremors and numbness.  Hematological: Negative for adenopathy. Does not bruise/bleed easily.  Psychiatric/Behavioral: Negative for behavioral problems and sleep disturbance. The patient is not nervous/anxious.     Vital Signs: BP 127/88   Pulse 91   Temp 98.2 F (36.8 C)   Resp 16   Ht 5\' 2"  (1.575 m)   Wt 150  lb 9.6 oz (68.3 kg)   SpO2 99%   BMI 27.55 kg/m    Physical Exam Vitals and nursing note reviewed.  Constitutional:      General: She is not in acute distress.    Appearance: She is well-developed. She is not diaphoretic.  HENT:     Head: Normocephalic and atraumatic.     Mouth/Throat:     Pharynx: No oropharyngeal exudate.  Eyes:     Pupils: Pupils are equal, round, and reactive to light.  Neck:     Thyroid: No thyromegaly.     Vascular: No JVD.     Trachea: No tracheal deviation.  Cardiovascular:     Rate and Rhythm: Normal rate and regular rhythm.     Heart sounds: Normal heart sounds. No murmur heard.  No friction rub. No gallop.   Pulmonary:     Effort: Pulmonary effort is normal. No respiratory distress.     Breath sounds: Normal breath sounds. No wheezing or rales.  Chest:     Chest wall: No tenderness.  Abdominal:     Palpations: Abdomen is soft.     Tenderness: There is no abdominal tenderness. There is no guarding.  Musculoskeletal:        General: Normal range of motion.     Cervical back: Normal range of motion and neck supple.  Lymphadenopathy:     Cervical: No cervical adenopathy.  Skin:    General: Skin is warm and dry.  Neurological:     Mental Status: She is alert and oriented to person, place, and time.     Cranial Nerves: No cranial nerve deficit.  Psychiatric:        Behavior: Behavior normal.        Thought Content: Thought content normal.        Judgment: Judgment normal.    Assessment/Plan: 1. Anxiety Stable, continue to use Xanax as prn.   2. Depression, recurrent (HCC) Well controlled, no issues at this time.   3. Nicotine dependence with current use Smoking cessation counseling: 1. Pt acknowledges the risks of long term smoking, she will try to quite smoking. 2. Options for different medications including nicotine products, chewing gum, patch etc, Wellbutrin and Chantix is discussed 3. Goal and date of compete cessation is  discussed 4. Total time spent in smoking cessation is 15 min.   General Counseling: verbalizes understanding of the findings of todays visit and agrees with plan of treatment. I have discussed any further diagnostic evaluation that may be needed or ordered today. We also reviewed her medications today. she has been encouraged to call the office with any questions or concerns that should arise related to todays visit.    Orders Placed This Encounter  Procedures  . POCT Urine Drug Screen    No orders of the  defined types were placed in this encounter.   Time spent: 25 Minutes   This patient was seen by Blima Ledger AGNP-C in Collaboration with Dr Lyndon Code as a part of collaborative care agreement     Johnna Acosta AGNP-C Internal medicine

## 2020-03-15 ENCOUNTER — Encounter: Payer: Self-pay | Admitting: Hospice and Palliative Medicine

## 2020-03-15 ENCOUNTER — Other Ambulatory Visit: Payer: Self-pay

## 2020-03-15 ENCOUNTER — Ambulatory Visit (INDEPENDENT_AMBULATORY_CARE_PROVIDER_SITE_OTHER): Payer: BC Managed Care – PPO | Admitting: Hospice and Palliative Medicine

## 2020-03-15 DIAGNOSIS — R110 Nausea: Secondary | ICD-10-CM | POA: Diagnosis not present

## 2020-03-15 DIAGNOSIS — R1010 Upper abdominal pain, unspecified: Secondary | ICD-10-CM | POA: Diagnosis not present

## 2020-03-15 MED ORDER — ONDANSETRON HCL 4 MG PO TABS: 4.0000 mg | ORAL_TABLET | Freq: Three times a day (TID) | ORAL | 0 refills | Status: DC | PRN

## 2020-03-15 NOTE — Progress Notes (Signed)
[facility] [address] [place], [place] [address]  Internal MEDICINE  Office Visit Note  Patient Name: Patricia Wiley  [id number]  [id number]  Date of Service: 03/16/2020  Chief Complaint  Patient presents with  . Acute Visit    stomach cramps worse after eating, nausea, white poop,   . Depression  . Quality Metric Gaps    HepC, HIVscreening, TDAP    HPI Patient is being seen today for a sick visit. Complains today of abnormal stool color. Since Monday she has noticed that her stools have been white in color She is also having upper abdominal cramping and pain, also lots of gas. These symptoms are not related to eating or types of foods she has been eating. She continues to have nausea that has been ongoing for a few months. Denies blood in her stools.  Current Medication: Outpatient Encounter Medications as of 03/15/2020  Medication Sig  . ALPRAZolam (XANAX) 0.25 MG tablet Take 1 tablet (0.25 mg total) by mouth 2 (two) times daily as needed. Half to one tab tab as needed for panic attacks  . ondansetron (ZOFRAN) 4 MG tablet Take 1 tablet (4 mg total) by mouth every 8 (eight) hours as needed for nausea or vomiting.   No facility-administered encounter medications on file as of 03/15/2020.    Surgical History: History reviewed. No pertinent surgical history.  Medical History: Past Medical History:  Diagnosis Date  . Depression     Family History: Family History  Problem Relation Age of Onset  . Heart disease Mother     Social History   Socioeconomic History  . Marital status: Single    Spouse name: Not on file  . Number of children: Not on file  . Years of education: Not on file  . Highest education level: Not on file  Occupational History  . Not on file  Tobacco Use  . Smoking status: Current Every Day Smoker    Packs/day: 1.00    Types: Cigarettes  . Smokeless tobacco: Never Used  Vaping Use  . Vaping Use: Never used  Substance and  Sexual Activity  . Alcohol use: Yes    Comment: occasionaly  . Drug use: No  . Sexual activity: Yes    Birth control/protection: None  Other Topics Concern  . Not on file  Social History Narrative  . Not on file   Social Determinants of Health   Financial Resource Strain:   . Difficulty of Paying Living Expenses: Not on file  Food Insecurity:   . Worried About Programme researcher, broadcasting/film/video in the Last Year: Not on file  . Ran Out of Food in the Last Year: Not on file  Transportation Needs:   . Lack of Transportation (Medical): Not on file  . Lack of Transportation (Non-Medical): Not on file  Physical Activity:   . Days of Exercise per Week: Not on file  . Minutes of Exercise per Session: Not on file  Stress:   . Feeling of Stress : Not on file  Social Connections:   . Frequency of Communication with Friends and Family: Not on file  . Frequency of Social Gatherings with Friends and Family: Not on file  . Attends Religious Services: Not on file  . Active Member of Clubs or Organizations: Not on file  . Attends Banker Meetings: Not on file  . Marital Status: Not on file  Intimate Partner Violence:   . Fear of Current or Ex-Partner: Not on  file  . Emotionally Abused: Not on file  . Physically Abused: Not on file  . Sexually Abused: Not on file      Review of Systems  Constitutional: Negative for chills, diaphoresis and fatigue.  HENT: Negative for ear pain, postnasal drip and sinus pressure.   Eyes: Negative for photophobia, discharge, redness, itching and visual disturbance.  Respiratory: Negative for cough, shortness of breath and wheezing.   Cardiovascular: Negative for chest pain, palpitations and leg swelling.  Gastrointestinal: Positive for abdominal pain and nausea. Negative for constipation, diarrhea and vomiting.       Stools white in color  Genitourinary: Negative for dysuria and flank pain.  Musculoskeletal: Negative for arthralgias, back pain, gait  problem and neck pain.  Skin: Negative for color change.  Allergic/Immunologic: Negative for environmental allergies and food allergies.  Neurological: Negative for dizziness and headaches.  Hematological: Does not bruise/bleed easily.  Psychiatric/Behavioral: Negative for agitation, behavioral problems (depression) and hallucinations.    Vital Signs: BP 135/86   Pulse 98   Temp 98.3 F (36.8 C)   Resp 16   Ht 5\' 2"  (1.575 m)   Wt 147 lb 12.8 oz (67 kg)   SpO2 100%   BMI 27.03 kg/m    Physical Exam Vitals reviewed.  Constitutional:      Appearance: Normal appearance.  HENT:     Mouth/Throat:     Mouth: Mucous membranes are moist.     Pharynx: Oropharynx is clear.  Cardiovascular:     Rate and Rhythm: Normal rate and regular rhythm.     Pulses: Normal pulses.     Heart sounds: Normal heart sounds.  Pulmonary:     Effort: Pulmonary effort is normal.     Breath sounds: Normal breath sounds.  Abdominal:     General: Abdomen is flat.     Tenderness: There is abdominal tenderness.  Musculoskeletal:        General: Normal range of motion.     Cervical back: Normal range of motion.  Skin:    General: Skin is warm.  Neurological:     General: No focal deficit present.     Mental Status: She is alert and oriented to person, place, and time. Mental status is at baseline.  Psychiatric:        Mood and Affect: Mood normal.        Behavior: Behavior normal.        Thought Content: Thought content normal.    Assessment/Plan: 1. Pain of upper abdomen Pain in bilateral upper abdominal quadrants. Since Monday she has noticed that her stools have been white in color. Will assess for gallbladder disease on abdominal Friday. Depending upon results will refer to GI for symptom management. - US Abdomen Complete; Future  2. Nausea Chronic nausea that has been ongoing for several months, has been exacerbated by acute illness. Zofran prescribed, advised to take on as needed basis.  Advised Zofran may make her drowsy and to take medication with caution. - ondansetron (ZOFRAN) 4 MG tablet; Take 1 tablet (4 mg total) by mouth every 8 (eight) hours as needed for nausea or vomiting.  Dispense: 20 tablet; Refill: 0  General Counseling: [place] verbalizes understanding of the findings of todays visit and agrees with plan of treatment. I have discussed any further diagnostic evaluation that may be needed or ordered today. We also reviewed her medications today. she has been encouraged to call the office with any questions or concerns that should arise related to  todays visit.    Orders Placed This Encounter  Procedures  . US Abdomen Complete    Meds ordered this encounter  Medications  . ondansetron (ZOFRAN) 4 MG tablet    Sig: Take 1 tablet (4 mg total) by mouth every 8 (eight) hours as needed for nausea or vomiting.    Dispense:  20 tablet    Refill:  0    Time spent: 25 Minutes   This patient was seen by Leeanne Deed AGNP-C in Collaboration with Dr Lyndon Code as a part of collaborative care agreement     Lubertha Basque. Loic Hobin AGNP-C Internal medicine

## 2020-03-16 ENCOUNTER — Other Ambulatory Visit: Payer: Self-pay | Admitting: Hospice and Palliative Medicine

## 2020-03-16 ENCOUNTER — Ambulatory Visit
Admission: RE | Admit: 2020-03-16 | Discharge: 2020-03-16 | Disposition: A | Discharge status: Home / Self Care | Payer: BC Managed Care – PPO | Source: Ambulatory Visit | Attending: Hospice and Palliative Medicine | Admitting: Hospice and Palliative Medicine

## 2020-03-16 ENCOUNTER — Encounter: Payer: Self-pay | Admitting: Hospice and Palliative Medicine

## 2020-03-16 ENCOUNTER — Telehealth: Payer: Self-pay

## 2020-03-16 DIAGNOSIS — R1010 Upper abdominal pain, unspecified: Secondary | ICD-10-CM | POA: Diagnosis not present

## 2020-03-16 DIAGNOSIS — R109 Unspecified abdominal pain: Secondary | ICD-10-CM | POA: Diagnosis not present

## 2020-03-16 NOTE — Telephone Encounter (Signed)
Pt called asking about her [place] results from this morning, Patricia Wiley reviewed them and advised they were normal and I informed pt of results and advised we would send referral for GI.

## 2020-04-11 DIAGNOSIS — J209 Acute bronchitis, unspecified: Secondary | ICD-10-CM | POA: Diagnosis not present

## 2020-04-11 DIAGNOSIS — R110 Nausea: Secondary | ICD-10-CM | POA: Diagnosis not present

## 2020-04-11 DIAGNOSIS — J019 Acute sinusitis, unspecified: Secondary | ICD-10-CM | POA: Diagnosis not present

## 2020-04-11 DIAGNOSIS — Z209 Contact with and (suspected) exposure to unspecified communicable disease: Secondary | ICD-10-CM | POA: Diagnosis not present

## 2020-04-23 ENCOUNTER — Ambulatory Visit: Payer: BC Managed Care – PPO | Admitting: Adult Health

## 2020-04-23 ENCOUNTER — Encounter: Payer: Self-pay | Admitting: Adult Health

## 2020-04-23 ENCOUNTER — Other Ambulatory Visit: Payer: Self-pay

## 2020-04-23 VITALS — BP 112/66 | Temp 98.6°F | Resp 16 | Ht 62.0 in | Wt 147.0 lb

## 2020-04-23 DIAGNOSIS — F419 Anxiety disorder, unspecified: Secondary | ICD-10-CM

## 2020-04-23 DIAGNOSIS — R1010 Upper abdominal pain, unspecified: Secondary | ICD-10-CM

## 2020-04-23 DIAGNOSIS — F339 Major depressive disorder, recurrent, unspecified: Secondary | ICD-10-CM | POA: Diagnosis not present

## 2020-04-23 DIAGNOSIS — K5900 Constipation, unspecified: Secondary | ICD-10-CM | POA: Diagnosis not present

## 2020-04-23 DIAGNOSIS — F17200 Nicotine dependence, unspecified, uncomplicated: Secondary | ICD-10-CM

## 2020-04-23 MED ORDER — DICYCLOMINE HCL 10 MG PO CAPS: 10.0000 mg | ORAL_CAPSULE | Freq: Two times a day (BID) | ORAL | 0 refills | Status: DC | PRN

## 2020-04-23 NOTE — Progress Notes (Signed)
[facility] [address] [place], [place] [address]  Internal MEDICINE  Office Visit Note  Patient Name: Patricia Wiley  [id number]  [id number]  Date of Service: 04/23/2020  Chief Complaint  Patient presents with  . Follow-up    8 week weight loss  . controlled substance policy    acknowledged  . Quality Metric Gaps    HIV screen, Hep C screen, Tdap  . Abdominal Pain    started 4-5 months ago, seen taylor for it    HPI  PT is seen today for follow up. She reports she has been having some abdominal pain and cramping over the last two months.  She has been seen, and had a Korea that was benign.  She has an appt with GI next month.  She describes the abdominal pain as spasming.  She also describes bilaterally flank pain that is intermittent. No diarrhea, or fever.  She has been constipated, and has been taking some OTC laxatives to help.      Current Medication: Outpatient Encounter Medications as of 04/23/2020  Medication Sig  . ALPRAZolam (XANAX) 0.25 MG tablet Take 1 tablet (0.25 mg total) by mouth 2 (two) times daily as needed. Half to one tab tab as needed for panic attacks  . ondansetron (ZOFRAN) 4 MG tablet Take 1 tablet (4 mg total) by mouth every 8 (eight) hours as needed for nausea or vomiting.  . dicyclomine (BENTYL) 10 MG capsule Take 1 capsule (10 mg total) by mouth 2 (two) times daily as needed for spasms.   No facility-administered encounter medications on file as of 04/23/2020.    Surgical History: History reviewed. No pertinent surgical history.  Medical History: Past Medical History:  Diagnosis Date  . Depression     Family History: Family History  Problem Relation Age of Onset  . Heart disease Mother     Social History   Socioeconomic History  . Marital status: Single    Spouse name: Not on file  . Number of children: Not on file  . Years of education: Not on file  . Highest education level: Not on file  Occupational History   . Not on file  Tobacco Use  . Smoking status: Current Every Day Smoker    Packs/day: 1.00    Types: Cigarettes  . Smokeless tobacco: Never Used  Vaping Use  . Vaping Use: Never used  Substance and Sexual Activity  . Alcohol use: Yes    Comment: occasionaly  . Drug use: No  . Sexual activity: Yes    Birth control/protection: None  Other Topics Concern  . Not on file  Social History Narrative  . Not on file   Social Determinants of Health   Financial Resource Strain:   . Difficulty of Paying Living Expenses: Not on file  Food Insecurity:   . Worried About Programme researcher, broadcasting/film/video in the Last Year: Not on file  . Ran Out of Food in the Last Year: Not on file  Transportation Needs:   . Lack of Transportation (Medical): Not on file  . Lack of Transportation (Non-Medical): Not on file  Physical Activity:   . Days of Exercise per Week: Not on file  . Minutes of Exercise per Session: Not on file  Stress:   . Feeling of Stress : Not on file  Social Connections:   . Frequency of Communication with Friends and Family: Not on file  . Frequency of Social Gatherings with Friends and Family: Not  on file  . Attends Religious Services: Not on file  . Active Member of Clubs or Organizations: Not on file  . Attends Banker Meetings: Not on file  . Marital Status: Not on file  Intimate Partner Violence:   . Fear of Current or Ex-Partner: Not on file  . Emotionally Abused: Not on file  . Physically Abused: Not on file  . Sexually Abused: Not on file      Review of Systems  Constitutional: Negative for chills, fatigue and unexpected weight change.  HENT: Negative for congestion, rhinorrhea, sneezing and sore throat.   Eyes: Negative for photophobia, pain and redness.  Respiratory: Negative for cough, chest tightness and shortness of breath.   Cardiovascular: Negative for chest pain and palpitations.  Gastrointestinal: Negative for abdominal pain, constipation, diarrhea,  nausea and vomiting.  Endocrine: Negative.   Genitourinary: Negative for dysuria and frequency.  Musculoskeletal: Negative for arthralgias, back pain, joint swelling and neck pain.  Skin: Negative for rash.  Allergic/Immunologic: Negative.   Neurological: Negative for tremors and numbness.  Hematological: Negative for adenopathy. Does not bruise/bleed easily.  Psychiatric/Behavioral: Negative for behavioral problems and sleep disturbance. The patient is not nervous/anxious.     Vital Signs: BP 112/66   Temp 98.6 F (37 C)   Resp 16   Ht 5\' 2"  (1.575 m)   Wt 147 lb (66.7 kg)   BMI 26.89 kg/m    Physical Exam Vitals and nursing note reviewed.  Constitutional:      General: She is not in acute distress.    Appearance: She is well-developed. She is not diaphoretic.  HENT:     Head: Normocephalic and atraumatic.     Mouth/Throat:     Pharynx: No oropharyngeal exudate.  Eyes:     Pupils: Pupils are equal, round, and reactive to light.  Neck:     Thyroid: No thyromegaly.     Vascular: No JVD.     Trachea: No tracheal deviation.  Cardiovascular:     Rate and Rhythm: Normal rate and regular rhythm.     Heart sounds: Normal heart sounds. No murmur heard.  No friction rub. No gallop.   Pulmonary:     Effort: Pulmonary effort is normal. No respiratory distress.     Breath sounds: Normal breath sounds. No wheezing or rales.  Chest:     Chest wall: No tenderness.  Abdominal:     Palpations: Abdomen is soft.     Tenderness: There is no abdominal tenderness. There is no guarding.  Musculoskeletal:        General: Normal range of motion.     Cervical back: Normal range of motion and neck supple.  Lymphadenopathy:     Cervical: No cervical adenopathy.  Skin:    General: Skin is warm and dry.  Neurological:     Mental Status: She is alert and oriented to person, place, and time.     Cranial Nerves: No cranial nerve deficit.  Psychiatric:        Behavior: Behavior normal.         Thought Content: Thought content normal.        Judgment: Judgment normal.    Assessment/Plan: 1. Pain of upper abdomen Use Bentyl PRN, follow up with GI as scheduled.   2. Constipation, unspecified constipation type Use miralax daily as discussed follow up with gi next month as scheduled.   3. Depression, recurrent (HCC) Stable, continue current management.   4. Anxiety No recent issues,  continue as before.   5. Nicotine dependence with current use Smoking cessation counseling: 1. Pt acknowledges the risks of long term smoking, she will try to quite smoking. 2. Options for different medications including nicotine products, chewing gum, patch etc, Wellbutrin and Chantix is discussed 3. Goal and date of compete cessation is discussed 4. Total time spent in smoking cessation is 15 min.   General Counseling: Patricia Wiley verbalizes understanding of the findings of todays visit and agrees with plan of treatment. I have discussed any further diagnostic evaluation that may be needed or ordered today. We also reviewed her medications today. she has been encouraged to call the office with any questions or concerns that should arise related to todays visit.    No orders of the defined types were placed in this encounter.   Meds ordered this encounter  Medications  . dicyclomine (BENTYL) 10 MG capsule    Sig: Take 1 capsule (10 mg total) by mouth 2 (two) times daily as needed for spasms.    Dispense:  15 capsule    Refill:  0    Time spent: 25 Minutes   This patient was seen by Blima Ledger AGNP-C in Collaboration with Dr Lyndon Code as a part of collaborative care agreement     Johnna Acosta AGNP-C Internal medicine

## 2020-05-31 ENCOUNTER — Ambulatory Visit (INDEPENDENT_AMBULATORY_CARE_PROVIDER_SITE_OTHER): Payer: BC Managed Care – PPO | Admitting: Gastroenterology

## 2020-05-31 ENCOUNTER — Encounter: Payer: Self-pay | Admitting: Gastroenterology

## 2020-05-31 ENCOUNTER — Other Ambulatory Visit: Payer: Self-pay

## 2020-05-31 DIAGNOSIS — R1013 Epigastric pain: Secondary | ICD-10-CM | POA: Diagnosis not present

## 2020-05-31 DIAGNOSIS — K5900 Constipation, unspecified: Secondary | ICD-10-CM | POA: Diagnosis not present

## 2020-05-31 DIAGNOSIS — Z720 Tobacco use: Secondary | ICD-10-CM | POA: Insufficient documentation

## 2020-05-31 MED ORDER — PANTOPRAZOLE SODIUM 40 MG PO TBEC: 40.0000 mg | DELAYED_RELEASE_TABLET | Freq: Every day | ORAL | 0 refills | Status: DC

## 2020-05-31 MED ORDER — LINACLOTIDE 145 MCG PO CAPS: 145.0000 ug | ORAL_CAPSULE | Freq: Every day | ORAL | 3 refills | Status: DC

## 2020-06-01 NOTE — Progress Notes (Signed)
Patricia Wiley [address]  [address]  [place], [place] [address]  Main: [phone]  Fax: [phone]   Gastroenterology Consultation  Referring Provider:     Theotis Burrow, NP Primary Care Physician:  Johnna Acosta, NP Reason for Consultation:     Abdominal pain        HPI:    Chief Complaint  Patient presents with  . Abdominal Pain    Patient stated that for the past 2 years she's been having abdominal pain but for the past 6 months, it has gotten worse.    Patricia Wiley is a 24 y.o. y/o female referred for consultation & management  by Dr. Juanda Bond, Coralee North, NP.  Patient reports constipation or abdominal pain for the last 2 years with worsening over the last 6 months.  Cramping, dull, 5/10, nonradiating.  Not associated with any nausea or vomiting or altered bowel habits.  No blood in stool.  No family history of colon cancer.  No dysphagia.  No prior EGD or colonoscopy.  No relieving factors.  States was on omeprazole previously, once a day and it did not help so she stopped the medication.  Past Medical History:  Diagnosis Date  . Depression     History reviewed. No pertinent surgical history.  Prior to Admission medications   Medication Sig Start Date End Date Taking? Authorizing Provider  ALPRAZolam (XANAX) 0.25 MG tablet Take 1 tablet (0.25 mg total) by mouth 2 (two) times daily as needed. Half to one tab tab as needed for panic attacks 11/07/19  Yes [place], Coralee North, NP  polyethylene glycol (MIRALAX / GLYCOLAX) 17 g packet Take 17 g by mouth daily.   Yes [provider]  linaclotide (LINZESS) 145 MCG CAPS capsule Take 1 capsule (145 mcg total) by mouth daily. 05/31/20   Pasty Spillers, MD  pantoprazole (PROTONIX) 40 MG tablet Take 1 tablet (40 mg total) by mouth daily. 05/31/20   Pasty Spillers, MD    Family History  Problem Relation Age of Onset  . Heart disease Mother      Social History   Tobacco Use  . Smoking  status: Current Every Day Smoker    Packs/day: 1.00    Types: Cigarettes  . Smokeless tobacco: Never Used  Vaping Use  . Vaping Use: Never used  Substance Use Topics  . Alcohol use: Yes    Comment: occasionaly  . Drug use: No    Allergies as of 05/31/2020 - Review Complete 05/31/2020  Allergen Reaction Noted  . Levonorgestrel-ethinyl estrad  12/06/2019    Review of Systems:    All systems reviewed and negative except where noted in HPI.   Physical Exam:  There were no vitals taken for this visit. No LMP recorded. Psych:  Alert and cooperative. Normal mood and affect. General:   Alert,  Well-developed, well-nourished, pleasant and cooperative in NAD Head:  Normocephalic and atraumatic. Eyes:  Sclera clear, no icterus.   Conjunctiva pink. Ears:  Normal auditory acuity. Nose:  No deformity, discharge, or lesions. Mouth:  No deformity or lesions,oropharynx pink & moist. Neck:  Supple; no masses or thyromegaly. Abdomen:  Normal bowel sounds.  No bruits.  Soft, non-tender and non-distended without masses, hepatosplenomegaly or hernias noted.  No guarding or rebound tenderness.    Msk:  Symmetrical without gross deformities. Good, equal movement & strength bilaterally. Pulses:  Normal pulses noted. Extremities:  No clubbing or edema.  No cyanosis. Neurologic:  Alert  and oriented x3;  grossly normal neurologically. Skin:  Intact without significant lesions or rashes. No jaundice. Lymph Nodes:  No significant cervical adenopathy. Psych:  Alert and cooperative. Normal mood and affect.   Labs: CBC    Component Value Date/Time   WBC 5.3 11/01/2019 0814   WBC 5.5 08/25/2017 0910   RBC 4.86 11/01/2019 0814   RBC 4.94 08/25/2017 0910   HGB 14.5 11/01/2019 0814   HCT 44.7 11/01/2019 0814   PLT 340 11/01/2019 0814   MCV 92 11/01/2019 0814   MCH 29.8 11/01/2019 0814   MCH 30.4 08/25/2017 0910   MCHC 32.4 11/01/2019 0814   MCHC 33.7 08/25/2017 0910   RDW 12.0 11/01/2019 0814    LYMPHSABS 1.8 11/01/2019 0814   EOSABS 0.1 11/01/2019 0814   BASOSABS 0.0 11/01/2019 0814   CMP     Component Value Date/Time   NA 141 11/01/2019 0814   K 3.9 11/01/2019 0814   CL 103 11/01/2019 0814   CO2 22 11/01/2019 0814   GLUCOSE 86 11/01/2019 0814   GLUCOSE 98 08/25/2017 0910   BUN 9 11/01/2019 0814   CREATININE 0.70 11/01/2019 0814   CALCIUM 9.6 11/01/2019 0814   PROT 6.7 11/01/2019 0814   ALBUMIN 4.6 11/01/2019 0814   AST 20 11/01/2019 0814   ALT 16 11/01/2019 0814   ALKPHOS 55 11/01/2019 0814   BILITOT 0.4 11/01/2019 0814   GFRNONAA 121 11/01/2019 0814   GFRAA 139 11/01/2019 0814    Imaging Studies: No results found.  Assessment and Plan:   Patricia A Wiley is a 24 y.o. y/o female has been referred for abdominal pain  Recent lab work and abdominal ultrasound is reassuring  Abdominal pain likely related to her constipation  She has tried MiraLAX in the past and it did not work well  We will start Linzess  Also obtain H. pylori breath test due to her symptoms  Trial of PPI once daily once H. pylori breath test is obtained  Dr Patricia Wiley  Speech recognition software was used to dictate the above note.

## 2020-06-02 LAB — H. PYLORI BREATH TEST: H pylori Breath Test: NEGATIVE

## 2020-06-05 ENCOUNTER — Ambulatory Visit: Payer: BC Managed Care – PPO | Admitting: Hospice and Palliative Medicine

## 2020-06-05 ENCOUNTER — Encounter: Payer: Self-pay | Admitting: Hospice and Palliative Medicine

## 2020-06-05 ENCOUNTER — Other Ambulatory Visit: Payer: Self-pay

## 2020-06-05 DIAGNOSIS — R1010 Upper abdominal pain, unspecified: Secondary | ICD-10-CM | POA: Diagnosis not present

## 2020-06-05 DIAGNOSIS — K5900 Constipation, unspecified: Secondary | ICD-10-CM

## 2020-06-05 NOTE — Progress Notes (Signed)
[facility] [address] [place], [place] [address]  Internal MEDICINE  Office Visit Note  Patient Name: Patricia Wiley  [id number]  [id number]  Date of Service: 06/05/2020  Chief Complaint  Patient presents with  . Follow-up    stomach issues x6 months  . Depression  . policy update form    received    HPI Patient is here for routine follow-up Has been evaluated by Gi for chronic constipation as well as abdominal pain--has been ongoing for 6 months She is becoming increasingly frustrated as she has seen several providers and she has not been told why she remains constipated, has taken Miralax in the past for constipation, she now feels as though this no longer works at relieving her symptoms Gi started her on [employer], also started on omeprazole--she is not taking this as she does not feel that she has reflux, reports no symptoms of reflux Abdominal pain occurs when she is constipated--is relieved when she has a BM Last BM was one day ago but stool was hard and she had to strain to have BM GI did not offer colonoscopy or other testing  Current Medication: Outpatient Encounter Medications as of 06/05/2020  Medication Sig  . ALPRAZolam (XANAX) 0.25 MG tablet Take 1 tablet (0.25 mg total) by mouth 2 (two) times daily as needed. Half to one tab tab as needed for panic attacks  . polyethylene glycol (MIRALAX / GLYCOLAX) 17 g packet Take 17 g by mouth daily.  Marland Kitchen linaclotide (LINZESS) 145 MCG CAPS capsule Take 1 capsule (145 mcg total) by mouth daily. (Patient not taking: Reported on 06/05/2020)  . pantoprazole (PROTONIX) 40 MG tablet Take 1 tablet (40 mg total) by mouth daily. (Patient not taking: Reported on 06/05/2020)   No facility-administered encounter medications on file as of 06/05/2020.    Surgical History: History reviewed. No pertinent surgical history.  Medical History: Past Medical History:  Diagnosis Date  . Depression      Family History: Family History  Problem Relation Age of Onset  . Heart disease Mother     Social History   Socioeconomic History  . Marital status: Single    Spouse name: Not on file  . Number of children: Not on file  . Years of education: Not on file  . Highest education level: Not on file  Occupational History  . Not on file  Tobacco Use  . Smoking status: Current Every Day Smoker    Packs/day: 1.00    Types: Cigarettes  . Smokeless tobacco: Never Used  Vaping Use  . Vaping Use: Never used  Substance and Sexual Activity  . Alcohol use: Yes    Comment: occasionaly  . Drug use: No  . Sexual activity: Yes    Birth control/protection: None  Other Topics Concern  . Not on file  Social History Narrative  . Not on file   Social Determinants of Health   Financial Resource Strain:   . Difficulty of Paying Living Expenses: Not on file  Food Insecurity:   . Worried About Programme researcher, broadcasting/film/video in the Last Year: Not on file  . Ran Out of Food in the Last Year: Not on file  Transportation Needs:   . Lack of Transportation (Medical): Not on file  . Lack of Transportation (Non-Medical): Not on file  Physical Activity:   . Days of Exercise per Week: Not on file  . Minutes of Exercise per Session: Not on file  Stress:   .  Feeling of Stress : Not on file  Social Connections:   . Frequency of Communication with Friends and Family: Not on file  . Frequency of Social Gatherings with Friends and Family: Not on file  . Attends Religious Services: Not on file  . Active Member of Clubs or Organizations: Not on file  . Attends Banker Meetings: Not on file  . Marital Status: Not on file  Intimate Partner Violence:   . Fear of Current or Ex-Partner: Not on file  . Emotionally Abused: Not on file  . Physically Abused: Not on file  . Sexually Abused: Not on file      Review of Systems  Constitutional: Negative for chills, diaphoresis and fatigue.  HENT:  Negative for ear pain, postnasal drip and sinus pressure.   Eyes: Negative for photophobia, discharge, redness, itching and visual disturbance.  Respiratory: Negative for cough, shortness of breath and wheezing.   Cardiovascular: Negative for chest pain, palpitations and leg swelling.  Gastrointestinal: Positive for abdominal distention, abdominal pain and constipation. Negative for diarrhea, nausea and vomiting.  Genitourinary: Negative for dysuria and flank pain.  Musculoskeletal: Negative for arthralgias, back pain, gait problem and neck pain.  Skin: Negative for color change.  Allergic/Immunologic: Negative for environmental allergies and food allergies.  Neurological: Negative for dizziness and headaches.  Hematological: Does not bruise/bleed easily.  Psychiatric/Behavioral: Negative for agitation, behavioral problems (depression) and hallucinations.    Vital Signs: BP 116/82   Pulse 88   Temp 97.8 F (36.6 C)   Resp 16   Ht 5\' 2"  (1.575 m)   Wt 143 lb 12.8 oz (65.2 kg)   SpO2 98%   BMI 26.30 kg/m    Physical Exam Constitutional:      Appearance: Normal appearance. She is normal weight.  Cardiovascular:     Rate and Rhythm: Normal rate and regular rhythm.     Pulses: Normal pulses.     Heart sounds: Normal heart sounds.  Pulmonary:     Effort: Pulmonary effort is normal.     Breath sounds: Normal breath sounds.  Abdominal:     General: There is distension.     Palpations: Abdomen is soft.  Musculoskeletal:        General: Normal range of motion.     Cervical back: Normal range of motion.  Skin:    General: Skin is warm.  Neurological:     General: No focal deficit present.     Mental Status: She is alert and oriented to person, place, and time. Mental status is at baseline.  Psychiatric:        Mood and Affect: Mood normal.        Behavior: Behavior normal.        Thought Content: Thought content normal.        Judgment: Judgment normal.     Assessment/Plan: 1. Pain of upper abdomen Discussed possibly wanting a second opinion as she was not pleased with her visit at GI office She would like some time to consider this as she is also frustrated by financial concerns related to seeing a specialist and not finding answers to her problems  2. Constipation, unspecified constipation type Dicussed alternative options while awaiting Linzess approval Prune juice, coffee with butter, blueberries and to keep up her fluid intake May benefit from suppository or enema--can purchase these OTC if she is interested  General Counseling: verbalizes understanding of the findings of todays visit and agrees with plan of treatment. I  have discussed any further diagnostic evaluation that may be needed or ordered today. We also reviewed her medications today. she has been encouraged to call the office with any questions or concerns that should arise related to todays visit.   Time spent: 30 Minutes Time spent includes review of chart, medications, test results and follow-up plan with the patient.  This patient was seen by Leeanne Deed AGNP-C in Collaboration with Dr Lyndon Code as a part of collaborative care agreement     Lubertha Basque. Henley Boettner AGNP-C Internal medicine

## 2020-06-10 ENCOUNTER — Encounter: Payer: Self-pay | Admitting: Hospice and Palliative Medicine

## 2020-06-13 DIAGNOSIS — U071 COVID-19: Secondary | ICD-10-CM | POA: Diagnosis not present

## 2020-06-13 DIAGNOSIS — J029 Acute pharyngitis, unspecified: Secondary | ICD-10-CM | POA: Diagnosis not present

## 2020-06-13 DIAGNOSIS — Z03818 Encounter for observation for suspected exposure to other biological agents ruled out: Secondary | ICD-10-CM | POA: Diagnosis not present

## 2020-06-13 DIAGNOSIS — R0789 Other chest pain: Secondary | ICD-10-CM | POA: Diagnosis not present

## 2020-06-13 DIAGNOSIS — R0600 Dyspnea, unspecified: Secondary | ICD-10-CM | POA: Diagnosis not present

## 2020-06-13 DIAGNOSIS — R059 Cough, unspecified: Secondary | ICD-10-CM | POA: Diagnosis not present

## 2020-06-18 ENCOUNTER — Emergency Department: Payer: BC Managed Care – PPO

## 2020-06-18 ENCOUNTER — Emergency Department
Admission: EM | Admit: 2020-06-18 | Discharge: 2020-06-18 | Disposition: A | Discharge status: Home / Self Care | Payer: BC Managed Care – PPO | Attending: Student in an Organized Health Care Education/Training Program | Admitting: Student in an Organized Health Care Education/Training Program

## 2020-06-18 ENCOUNTER — Other Ambulatory Visit: Payer: Self-pay

## 2020-06-18 DIAGNOSIS — F17210 Nicotine dependence, cigarettes, uncomplicated: Secondary | ICD-10-CM | POA: Insufficient documentation

## 2020-06-18 DIAGNOSIS — U071 COVID-19: Secondary | ICD-10-CM | POA: Diagnosis not present

## 2020-06-18 DIAGNOSIS — R0602 Shortness of breath: Secondary | ICD-10-CM | POA: Diagnosis not present

## 2020-06-18 LAB — CBC
HCT: 43.4 % (ref 36.0–46.0)
Hemoglobin: 15.1 g/dL — ABNORMAL HIGH (ref 12.0–15.0)
MCH: 31 pg (ref 26.0–34.0)
MCHC: 34.8 g/dL (ref 30.0–36.0)
MCV: 89.1 fL (ref 80.0–100.0)
Platelets: 295 10*3/uL (ref 150–400)
RBC: 4.87 MIL/uL (ref 3.87–5.11)
RDW: 11.9 % (ref 11.5–15.5)
WBC: 6.1 10*3/uL (ref 4.0–10.5)
nRBC: 0 % (ref 0.0–0.2)

## 2020-06-18 LAB — BASIC METABOLIC PANEL
Anion gap: 13 (ref 5–15)
BUN: 14 mg/dL (ref 6–20)
CO2: 24 mmol/L (ref 22–32)
Calcium: 9.4 mg/dL (ref 8.9–10.3)
Chloride: 103 mmol/L (ref 98–111)
Creatinine, Ser: 0.63 mg/dL (ref 0.44–1.00)
GFR, Estimated: >60 mL/min (ref >60)
Glucose, Bld: 86 mg/dL (ref 70–99)
Potassium: 3.4 mmol/L — ABNORMAL LOW (ref 3.5–5.1)
Sodium: 140 mmol/L (ref 135–145)

## 2020-06-18 NOTE — ED Provider Notes (Signed)
[facility] Emergency Department Provider Note    First MD Initiated Contact with Patient 06/18/20 1718     (approximate)  I have reviewed the triage vital signs and the nursing notes.   HISTORY  Chief Complaint Shortness of Breath    HPI Patricia Wiley is a 24 y.o. female presents to the ER for evaluation of cough and shortness of breath generalized malaise in the setting of known COVID-19 illness.  States that she was put on prednisone as well as albuterol inhaler without much improvement.  She is concerned because she is supposed to return to work tomorrow.  She still feeling symptomatic.  Not having any nausea but is having decreased p.o. intake.  Denies any pleuritic pain.  No past medical history.    Past Medical History:  Diagnosis Date  . Depression    Family History  Problem Relation Age of Onset  . Heart disease Mother    No past surgical history on file. Patient Active Problem List   Diagnosis Date Noted  . Tobacco use 05/31/2020  . Acute upper respiratory infection 04/17/2019  . Heavy periods 11/29/2018      Prior to Admission medications   Medication Sig Start Date End Date Taking? Authorizing Provider  ALPRAZolam (XANAX) 0.25 MG tablet Take 1 tablet (0.25 mg total) by mouth 2 (two) times daily as needed. Half to one tab tab as needed for panic attacks 11/07/19   Johnna Acosta, NP  linaclotide [facility]) 145 MCG CAPS capsule Take 1 capsule (145 mcg total) by mouth daily. Patient not taking: Reported on 06/05/2020 05/31/20   Pasty Spillers, MD  pantoprazole (PROTONIX) 40 MG tablet Take 1 tablet (40 mg total) by mouth daily. Patient not taking: Reported on 06/05/2020 05/31/20   Pasty Spillers, MD  polyethylene glycol (MIRALAX / GLYCOLAX) 17 g packet Take 17 g by mouth daily.    [provider]    Allergies Levonorgestrel-ethinyl estrad    Social History Social History   Tobacco Use  . Smoking  status: Current Every Day Smoker    Packs/day: 1.00    Types: Cigarettes  . Smokeless tobacco: Never Used  Vaping Use  . Vaping Use: Never used  Substance Use Topics  . Alcohol use: Yes    Comment: occasionaly  . Drug use: No    Review of Systems Patient denies headaches, rhinorrhea, blurry vision, numbness, shortness of breath, chest pain, edema, cough, abdominal pain, nausea, vomiting, diarrhea, dysuria, fevers, rashes or hallucinations unless otherwise stated above in HPI. ____________________________________________   PHYSICAL EXAM:  VITAL SIGNS: Vitals:   06/18/20 1556  Pulse: 67  Resp: 20  Temp: 98.6 F (37 C)  SpO2: 95%    Constitutional: Alert and oriented.  Eyes: Conjunctivae are normal.  Head: Atraumatic. Nose: No congestion/rhinnorhea. Mouth/Throat: Mucous membranes are moist.   Neck: No stridor. Painless ROM.  Cardiovascular: Normal rate, regular rhythm. Grossly normal heart sounds.  Good peripheral circulation. Respiratory: Normal respiratory effort.  No retractions. Lungs CTAB. Gastrointestinal: Soft and nontender. No distention. No abdominal bruits. No CVA tenderness. Genitourinary:  Musculoskeletal: No lower extremity tenderness nor edema.  No joint effusions. Neurologic:  Normal speech and language. No gross focal neurologic deficits are appreciated. No facial droop Skin:  Skin is warm, dry and intact. No rash noted. Psychiatric: Mood and affect are normal. Speech and behavior are normal.  ____________________________________________   LABS (all labs ordered are listed, but only abnormal results are displayed)  Results  for orders placed or performed during the hospital encounter of 06/18/20 (from the past 24 hour(s))  CBC     Status: Abnormal   Collection Time: 06/18/20  4:02 PM  Result Value Ref Range   WBC 6.1 4.0 - 10.5 K/uL   RBC 4.87 3.87 - 5.11 MIL/uL   Hemoglobin 15.1 (H) 12.0 - 15.0 g/dL   HCT 52.8 36 - 46 %   MCV 89.1 80.0 - 100.0 fL    MCH 31.0 26.0 - 34.0 pg   MCHC 34.8 30.0 - 36.0 g/dL   RDW 41.3 24.4 - 01.0 %   Platelets 295 150 - 400 K/uL   nRBC 0.0 0.0 - 0.2 %  Basic metabolic panel     Status: Abnormal   Collection Time: 06/18/20  4:02 PM  Result Value Ref Range   Sodium 140 135 - 145 mmol/L   Potassium 3.4 (L) 3.5 - 5.1 mmol/L   Chloride 103 98 - 111 mmol/L   CO2 24 22 - 32 mmol/L   Glucose, Bld 86 70 - 99 mg/dL   BUN 14 6 - 20 mg/dL   Creatinine, Ser 2.72 0.44 - 1.00 mg/dL   Calcium 9.4 8.9 - 53.6 mg/dL   GFR, Estimated >64 >40 mL/min   Anion gap 13 5 - 15   ____________________________________________   ____________________________________________  RADIOLOGY  I personally reviewed all radiographic images ordered to evaluate for the above acute complaints and reviewed radiology reports and findings.  These findings were personally discussed with the patient.  Please see medical record for radiology report.  ____________________________________________   PROCEDURES  Procedure(s) performed:  Procedures    Critical Care performed: no ____________________________________________   INITIAL IMPRESSION / ASSESSMENT AND PLAN / ED COURSE  Pertinent labs & imaging results that were available during my care of the patient were reviewed by me and considered in my medical decision making (see chart for details).   DDX: Asthma, COVID-19, anemia, electrolyte abnormality, dehydration, PE  Patricia A Schorsch is a 24 y.o. who presents to the ED with presentation as described above.  Patient clinically is very well-appearing she is not hypoxic no tachypnea.  Lung sounds are clear.  Chest x-ray without any infiltrates.  She is low risk by Wells criteria and is PERC negative.  This does not seem consistent with PE given her well appearance.  She is clearly symptomatic from covid 19 which explain her symptoms but on further further diagnostic imaging or other testing clinically indicated at this time.  She was  able to ambulate about the ER on continuous pulse oximetry without desaturating below 95% on room air.  Will external her leave from work as she is still symptomatic.  We discussed strict return precautions.  I instructed her to obtain a pulse oximeter to keep an eye on her O2 saturations.  Have discussed with the patient and available family all diagnostics and treatments performed thus far and all questions were answered to the best of my ability. The patient demonstrates understanding and agreement with plan.      The patient was evaluated in Emergency Department today for the symptoms described in the history of present illness. He/she was evaluated in the context of the global COVID-19 pandemic, which necessitated consideration that the patient might be at risk for infection with the SARS-CoV-2 virus that causes COVID-19. Institutional protocols and algorithms that pertain to the evaluation of patients at risk for COVID-19 are in a state of rapid change based on information released  by regulatory bodies including the CDC and federal and state organizations. These policies and algorithms were followed during the patient's care in the ED.  As part of my medical decision making, I reviewed the following data within the electronic MEDICAL RECORD NUMBER Nursing notes reviewed and incorporated, Labs reviewed, notes from prior ED visits and [place] Controlled Substance Database   ____________________________________________   FINAL CLINICAL IMPRESSION(S) / ED DIAGNOSES  Final diagnoses:  COVID-19 virus infection      NEW MEDICATIONS STARTED DURING THIS VISIT:  New Prescriptions   No medications on file     Note:  This document was prepared using Dragon voice recognition software and may include unintentional dictation errors.    Willy Eddy, MD 06/18/20 1740

## 2020-06-18 NOTE — ED Triage Notes (Signed)
Pt via POV from home. Pt dx with COVID on Wednesday. Pt c/o SOB, cough, and lightheadedness. Denies pain. Pt is A&Ox4 and NAD. Pt prescribed prednisone and inhaler with no relief.

## 2020-06-29 ENCOUNTER — Other Ambulatory Visit: Payer: Self-pay | Admitting: Gastroenterology

## 2020-07-02 NOTE — Telephone Encounter (Signed)
Can patient get her Pantoprazole refilled?

## 2020-07-03 ENCOUNTER — Encounter: Payer: Self-pay | Admitting: Hospice and Palliative Medicine

## 2020-07-04 ENCOUNTER — Telehealth: Payer: Self-pay

## 2020-07-04 NOTE — Telephone Encounter (Signed)
Pt advised samples for linzess ready for pickup

## 2020-08-06 ENCOUNTER — Ambulatory Visit: Payer: BC Managed Care – PPO | Admitting: Internal Medicine

## 2020-08-06 ENCOUNTER — Encounter: Payer: Self-pay | Admitting: Physician Assistant

## 2020-08-06 VITALS — BP 126/80 | HR 96 | Temp 98.2°F | Resp 16 | Ht 62.0 in | Wt 143.0 lb

## 2020-08-06 DIAGNOSIS — K5901 Slow transit constipation: Secondary | ICD-10-CM

## 2020-08-06 DIAGNOSIS — E638 Other specified nutritional deficiencies: Secondary | ICD-10-CM

## 2020-08-06 NOTE — Progress Notes (Signed)
[facility] [address] [place], [place] [address]  Internal MEDICINE  Office Visit Note  Patient Name: Patricia Wiley  [id number]  [id number]  Date of Service: 08/09/2020  Chief Complaint  Patient presents with  . Follow-up    Linzess not working anymore  . Depression    HPI Pt is here for routine follow up. Severe constipation, every 2-3 days BM, hard in consistency, Initially Linzess was working but not any more. She takes low fiber diet, denies any nausea or vomiting. No heart burn. Thinks this problem started about 8 months ago. Does pass gas. No bloating   Current Medication: Outpatient Encounter Medications as of 08/06/2020  Medication Sig  . ALPRAZolam (XANAX) 0.25 MG tablet Take 1 tablet (0.25 mg total) by mouth 2 (two) times daily as needed. Half to one tab tab as needed for panic attacks  . linaclotide (LINZESS) 145 MCG CAPS capsule Take 1 capsule (145 mcg total) by mouth daily. (Patient not taking: Reported on 06/05/2020)  . pantoprazole (PROTONIX) 40 MG tablet TAKE 1 TABLET(40 MG) BY MOUTH DAILY  . polyethylene glycol (MIRALAX / GLYCOLAX) 17 g packet Take 17 g by mouth daily.   No facility-administered encounter medications on file as of 08/06/2020.    Surgical History: History reviewed. No pertinent surgical history.  Medical History: Past Medical History:  Diagnosis Date  . Depression     Family History: Family History  Problem Relation Age of Onset  . Heart disease Mother     Social History   Socioeconomic History  . Marital status: Single    Spouse name: Not on file  . Number of children: Not on file  . Years of education: Not on file  . Highest education level: Not on file  Occupational History  . Not on file  Tobacco Use  . Smoking status: Current Every Day Smoker    Packs/day: 1.00    Types: Cigarettes  . Smokeless tobacco: Never Used  Vaping Use  . Vaping Use: Never used  Substance and Sexual Activity  . Alcohol use:  Yes    Comment: occasionaly  . Drug use: No  . Sexual activity: Yes    Birth control/protection: None  Other Topics Concern  . Not on file  Social History Narrative  . Not on file   Social Determinants of Health   Financial Resource Strain: Not on file  Food Insecurity: Not on file  Transportation Needs: Not on file  Physical Activity: Not on file  Stress: Not on file  Social Connections: Not on file  Intimate Partner Violence: Not on file      Review of Systems  Constitutional: Negative for fatigue, fever and unexpected weight change.  HENT: Negative for congestion, mouth sores and postnasal drip.   Respiratory: Negative for cough.   Cardiovascular: Negative for chest pain.  Gastrointestinal: Positive for constipation.  Genitourinary: Negative for dysuria and flank pain.  Psychiatric/Behavioral: Negative.     Vital Signs: BP 126/80   Pulse 96   Temp 98.2 F (36.8 C)   Resp 16   Ht 5\' 2"  (1.575 m)   Wt 143 lb (64.9 kg)   SpO2 98%   BMI 26.16 kg/m    Physical Exam Constitutional:      Appearance: Normal appearance.  HENT:     Head: Normocephalic and atraumatic.     Nose: Nose normal.  Eyes:     Extraocular Movements: Extraocular movements intact.  Cardiovascular:     Rate and  Rhythm: Normal rate and regular rhythm.  Pulmonary:     Effort: Pulmonary effort is normal.  Abdominal:     Palpations: Abdomen is soft. There is no mass.  Neurological:     Mental Status: She is alert.        Assessment/Plan: 1. Slow transit constipation - DG Abd 2 Views; Future, pt was instructed to take Psyllium husk every day, add fluids. Prn use of Dulcolax and herbal mild stimulant might help as well. Will need to see GI     2. Inadequate intake of plant fiber Increase fiber intake  General Counseling: [name] verbalizes understanding of the findings of todays visit and agrees with plan of treatment. I have discussed any further diagnostic evaluation that may be  needed or ordered today. We also reviewed her medications today. she has been encouraged to call the office with any questions or concerns that should arise related to todays visit.  Orders Placed This Encounter  Procedures  . DG Abd 2 Views      Total time spent: 30 Minutes Time spent includes review of chart, medications, test results, and follow up plan with the patient.   [place] Controlled Substance Database was reviewed by me.   Dr Lyndon Code Internal medicine

## 2020-08-09 ENCOUNTER — Encounter: Payer: Self-pay | Admitting: Internal Medicine

## 2020-08-13 ENCOUNTER — Other Ambulatory Visit: Payer: Self-pay | Admitting: Gastroenterology

## 2020-08-14 ENCOUNTER — Encounter: Payer: Self-pay | Admitting: Hospice and Palliative Medicine

## 2020-08-15 ENCOUNTER — Other Ambulatory Visit: Payer: Self-pay

## 2020-08-15 ENCOUNTER — Ambulatory Visit
Admission: RE | Admit: 2020-08-15 | Discharge: 2020-08-15 | Disposition: A | Discharge status: Home / Self Care | Payer: BC Managed Care – PPO | Source: Ambulatory Visit | Attending: Internal Medicine | Admitting: Internal Medicine

## 2020-08-15 DIAGNOSIS — K5901 Slow transit constipation: Secondary | ICD-10-CM | POA: Diagnosis not present

## 2020-08-15 DIAGNOSIS — R109 Unspecified abdominal pain: Secondary | ICD-10-CM | POA: Diagnosis not present

## 2020-08-17 NOTE — Progress Notes (Signed)
Please review and treat as indicated

## 2020-09-05 ENCOUNTER — Ambulatory Visit: Payer: BC Managed Care – PPO | Admitting: Gastroenterology

## 2020-09-05 ENCOUNTER — Other Ambulatory Visit: Payer: Self-pay

## 2020-09-05 ENCOUNTER — Encounter: Payer: Self-pay | Admitting: Gastroenterology

## 2020-09-06 ENCOUNTER — Ambulatory Visit: Payer: BC Managed Care – PPO | Admitting: Hospice and Palliative Medicine

## 2020-10-15 ENCOUNTER — Ambulatory Visit (INDEPENDENT_AMBULATORY_CARE_PROVIDER_SITE_OTHER): Payer: BC Managed Care – PPO | Admitting: Hospice and Palliative Medicine

## 2020-10-15 ENCOUNTER — Encounter: Payer: Self-pay | Admitting: Hospice and Palliative Medicine

## 2020-10-15 ENCOUNTER — Other Ambulatory Visit: Payer: Self-pay

## 2020-10-15 VITALS — BP 115/80 | HR 86 | Temp 98.0°F | Resp 16 | Ht 62.0 in | Wt 146.2 lb

## 2020-10-15 DIAGNOSIS — F17200 Nicotine dependence, unspecified, uncomplicated: Secondary | ICD-10-CM | POA: Diagnosis not present

## 2020-10-15 DIAGNOSIS — R300 Dysuria: Secondary | ICD-10-CM | POA: Diagnosis not present

## 2020-10-15 DIAGNOSIS — R1010 Upper abdominal pain, unspecified: Secondary | ICD-10-CM

## 2020-10-15 DIAGNOSIS — K5900 Constipation, unspecified: Secondary | ICD-10-CM

## 2020-10-15 DIAGNOSIS — R143 Flatulence: Secondary | ICD-10-CM | POA: Diagnosis not present

## 2020-10-15 DIAGNOSIS — R5383 Other fatigue: Secondary | ICD-10-CM

## 2020-10-15 DIAGNOSIS — Z0001 Encounter for general adult medical examination with abnormal findings: Secondary | ICD-10-CM | POA: Diagnosis not present

## 2020-10-15 DIAGNOSIS — K5901 Slow transit constipation: Secondary | ICD-10-CM

## 2020-10-15 NOTE — Progress Notes (Signed)
[facility] [place], [place] [address]  Internal MEDICINE  Office Visit Note  Patient Name: Patricia Wiley  [id number]  [id number]  Date of Service: 10/17/2020  Chief Complaint  Patient presents with  . Annual Exam  . Depression     HPI Pt is here for routine health maintenance examination Overall, things have been going well Has stopped taking Linzess and omeprazole prescribed by GI as she did not feel they were making a difference in her symptoms Continues to have bloating and constipation--feels this is related by certain foods but has a hard time distinguishing which foods could be causing these symptoms Has been diary free for several months and is now attempting a gluten free diet Requesting allergen food testing to help determine which specific foods could be contributing to her symptoms  PAP completed 2020--normal, will need repeat screening next year  Continues to smoke about 1/2 to 1 pack of cigarettes per day, we discussed cessation, she stopped smoking a few months ago while she was acutely sick with COVID but once her symptoms recovered she started smoking again  Stress and anxiety remain well controlled Sleeping well each night without difficulty  Current Medication: Outpatient Encounter Medications as of 10/15/2020  Medication Sig  . ALPRAZolam (XANAX) 0.25 MG tablet Take 1 tablet (0.25 mg total) by mouth 2 (two) times daily as needed. Half to one tab tab as needed for panic attacks  . fluticasone (FLONASE) 50 MCG/ACT nasal spray Place 1 spray into both nostrils 2 (two) times daily.  . [DISCONTINUED] LINZESS 145 MCG CAPS capsule Take 145 mcg by mouth daily.   No facility-administered encounter medications on file as of 10/15/2020.    Surgical History: History reviewed. No pertinent surgical history.  Medical History: Past Medical History:  Diagnosis Date  . Depression     Family History: Family History  Problem Relation  Age of Onset  . Heart disease Mother       Review of Systems  Constitutional: Negative for chills, diaphoresis and fatigue.  HENT: Negative for ear pain, postnasal drip and sinus pressure.   Eyes: Negative for photophobia, discharge, redness, itching and visual disturbance.  Respiratory: Negative for cough, shortness of breath and wheezing.   Cardiovascular: Negative for chest pain, palpitations and leg swelling.  Gastrointestinal: Negative for abdominal pain, constipation, diarrhea, nausea and vomiting.  Genitourinary: Negative for dysuria and flank pain.  Musculoskeletal: Negative for arthralgias, back pain, gait problem and neck pain.  Skin: Negative for color change.  Allergic/Immunologic: Negative for environmental allergies and food allergies.  Neurological: Negative for dizziness and headaches.  Hematological: Does not bruise/bleed easily.  Psychiatric/Behavioral: Negative for agitation, behavioral problems (depression) and hallucinations.     Vital Signs: BP 115/80   Pulse 86   Temp 98 F (36.7 C)   Resp 16   Ht _0  (1.575 m)   Wt 146 lb 3.2 oz (66.3 kg)   SpO2 98%   BMI 26.74 kg/m    Physical Exam Vitals reviewed.  Constitutional:      Appearance: Normal appearance. She is normal weight.  Cardiovascular:     Rate and Rhythm: Normal rate and regular rhythm.     Pulses: Normal pulses.     Heart sounds: Normal heart sounds.  Pulmonary:     Effort: Pulmonary effort is normal.     Breath sounds: Normal breath sounds.  Abdominal:     General: Abdomen is flat.     Palpations: Abdomen is  soft.  Musculoskeletal:        General: Normal range of motion.     Cervical back: Normal range of motion.  Skin:    General: Skin is warm.  Neurological:     General: No focal deficit present.     Mental Status: She is alert and oriented to person, place, and time. Mental status is at baseline.  Psychiatric:        Mood and Affect: Mood normal.        Behavior: Behavior  normal.        Thought Content: Thought content normal.        Judgment: Judgment normal.      LABS: Recent Results (from the past 2160 hour(s))  UA/M w/rflx Culture, Routine     Status: Abnormal (Preliminary result)   Collection Time: 10/15/20  5:01 PM   Specimen: Urine   Urine  Result Value Ref Range   Specific Gravity, UA      <=1.005 (A) 1.005 - 1.030   pH, UA 7.0 5.0 - 7.5   Color, UA Yellow Yellow   Appearance Ur Clear Clear   Leukocytes,UA 1+ (A) Negative   Protein,UA Negative Negative/Trace   Glucose, UA Negative Negative   Ketones, UA Negative Negative   RBC, UA Negative Negative   Bilirubin, UA Negative Negative   Urobilinogen, Ur 0.2 0.2 - 1.0 mg/dL   Nitrite, UA Negative Negative   Microscopic Examination See below:     Comment: Microscopic was indicated and was performed.   Urinalysis Reflex Comment     Comment: This specimen has reflexed to a Urine Culture.  Microscopic Examination     Status: Abnormal   Collection Time: 10/15/20  5:01 PM   Urine  Result Value Ref Range   WBC, UA 6-10 (A) 0 - 5 /hpf   RBC None seen 0 - 2 /hpf   Epithelial Cells (non renal) 0-10 0 - 10 /hpf   Casts None seen None seen /lpf   Bacteria, UA None seen None seen/Few  Urine Culture, Reflex     Status: None (Preliminary result)   Collection Time: 10/15/20  5:01 PM   Urine  Result Value Ref Range   Urine Culture, Routine WILL FOLLOW   CBC w/Diff/Platelet     Status: Abnormal   Collection Time: 10/16/20  8:18 AM  Result Value Ref Range   WBC 5.3 3.4 - 10.8 x10E3/uL   RBC 4.93 3.77 - 5.28 x10E6/uL   Hemoglobin 15.2 11.1 - 15.9 g/dL   Hematocrit 44.8 34.0 - 46.6 %   MCV 91 79 - 97 fL   MCH 30.8 26.6 - 33.0 pg   MCHC 33.9 31.5 - 35.7 g/dL   RDW 11.6 (L) 11.7 - 15.4 %   Platelets 286 150 - 450 x10E3/uL   Neutrophils 56 Not Estab. %   Lymphs 34 Not Estab. %   Monocytes 7 Not Estab. %   Eos 2 Not Estab. %   Basos 1 Not Estab. %   Neutrophils Absolute 3.0 1.4 - 7.0 x10E3/uL    Lymphocytes Absolute 1.8 0.7 - 3.1 x10E3/uL   Monocytes Absolute 0.4 0.1 - 0.9 x10E3/uL   EOS (ABSOLUTE) 0.1 0.0 - 0.4 x10E3/uL   Basophils Absolute 0.0 0.0 - 0.2 x10E3/uL   Immature Granulocytes 0 Not Estab. %   Immature Grans (Abs) 0.0 0.0 - 0.1 x10E3/uL  Comprehensive Metabolic Panel (CMET)     Status: Abnormal   Collection Time: 10/16/20  8:18 AM  Result Value Ref Range   Glucose 83 65 - 99 mg/dL   BUN 6 6 - 20 mg/dL   Creatinine, Ser 0.69 0.57 - 1.00 mg/dL   eGFR 123 >59 mL/min/1.73   BUN/Creatinine Ratio 9 9 - 23   Sodium 141 134 - 144 mmol/L   Potassium 4.1 3.5 - 5.2 mmol/L   Chloride 105 96 - 106 mmol/L   CO2 18 (L) 20 - 29 mmol/L   Calcium 9.4 8.7 - 10.2 mg/dL   Total Protein 7.2 6.0 - 8.5 g/dL   Albumin 4.9 3.9 - 5.0 g/dL   Globulin, Total 2.3 1.5 - 4.5 g/dL   Albumin/Globulin Ratio 2.1 1.2 - 2.2   Bilirubin Total 0.4 0.0 - 1.2 mg/dL   Alkaline Phosphatase 48 44 - 121 IU/L   AST 32 0 - 40 IU/L   ALT 22 0 - 32 IU/L  Lipid Panel With LDL/HDL Ratio     Status: None   Collection Time: 10/16/20  8:18 AM  Result Value Ref Range   Cholesterol, Total 168 100 - 199 mg/dL   Triglycerides 80 0 - 149 mg/dL   HDL 65 >39 mg/dL   VLDL Cholesterol Cal 15 5 - 40 mg/dL   LDL Chol Calc (NIH) 88 0 - 99 mg/dL   LDL/HDL Ratio 1.4 0.0 - 3.2 ratio    Comment:                                     LDL/HDL Ratio                                             Men  Women                               1/2 Avg.Risk  1.0    1.5                                   Avg.Risk  3.6    3.2                                2X Avg.Risk  6.2    5.0                                3X Avg.Risk  8.0    6.1   TSH + free T4     Status: None   Collection Time: 10/16/20  8:18 AM  Result Value Ref Range   TSH 1.620 0.450 - 4.500 uIU/mL   Free T4 1.18 0.82 - 1.77 ng/dL    Assessment/Plan: 1. Encounter for routine adult health examination with abnormal findings Well appearing 25 year old female Up to date on  age appropriate PHM Will review routine labs and adjust plan of care as indicated Declined breast exam - CBC w/Diff/Platelet - Comprehensive Metabolic Panel (CMET) - Lipid Panel With LDL/HDL Ratio - TSH + free T4  2. Constipation, unspecified constipation type Will review food allergen testing potentially contributing to non-specific GI symptoms - IgE Food w/Component  Reflex II  3. Pain of upper abdomen Will review food allergen testing potentially contributing to non-specific GI symptoms - IgE Food w/Component Reflex II  4. Flatulence Will review food allergen testing potentially contributing to non-specific GI symptoms - IgE Food w/Component Reflex II  5. Nicotine dependence with current use Smoking cessation counseling: 1. Pt acknowledges the risks of long term smoking, she will try to quite smoking. 2. Options for different medications including nicotine products, chewing gum, patch etc, Wellbutrin and Chantix is discussed 3. Goal and date of compete cessation is discussed 4. Total time spent in smoking cessation is 15 min.   6. Other fatigue - CBC w/Diff/Platelet - Comprehensive Metabolic Panel (CMET) - Lipid Panel With LDL/HDL Ratio - TSH + free T4  7. Dysuria - UA/M w/rflx Culture, Routine - Microscopic Examination - Urine Culture, Reflex  General Counseling: Patricia Wiley verbalizes understanding of the findings of todays visit and agrees with plan of treatment. I have discussed any further diagnostic evaluation that may be needed or ordered today. We also reviewed her medications today. she has been encouraged to call the office with any questions or concerns that should arise related to todays visit.    Counseling:    Orders Placed This Encounter  Procedures  . Microscopic Examination  . Urine Culture, Reflex  . CBC w/Diff/Platelet  . Comprehensive Metabolic Panel (CMET)  . Lipid Panel With LDL/HDL Ratio  . TSH + free T4  . IgE Food w/Component Reflex II   . UA/M w/rflx Culture, Routine      Total time spent: 30 Minutes  Time spent includes review of chart, medications, test results, and follow up plan with the patient.   This patient was seen by Theodoro Grist AGNP-C Collaboration with Dr Lavera Guise as a part of collaborative care agreement   Tanna Furry. [facility] Internal Medicine

## 2020-10-16 DIAGNOSIS — R1010 Upper abdominal pain, unspecified: Secondary | ICD-10-CM | POA: Diagnosis not present

## 2020-10-16 DIAGNOSIS — K5900 Constipation, unspecified: Secondary | ICD-10-CM | POA: Diagnosis not present

## 2020-10-16 DIAGNOSIS — R5383 Other fatigue: Secondary | ICD-10-CM | POA: Diagnosis not present

## 2020-10-16 DIAGNOSIS — R143 Flatulence: Secondary | ICD-10-CM | POA: Diagnosis not present

## 2020-10-17 ENCOUNTER — Encounter: Payer: Self-pay | Admitting: Hospice and Palliative Medicine

## 2020-10-17 LAB — COMPREHENSIVE METABOLIC PANEL
ALT: 22 IU/L (ref 0–32)
AST: 32 IU/L (ref 0–40)
Albumin/Globulin Ratio: 2.1 (ref 1.2–2.2)
Albumin: 4.9 g/dL (ref 3.9–5.0)
Alkaline Phosphatase: 48 IU/L (ref 44–121)
BUN/Creatinine Ratio: 9 (ref 9–23)
BUN: 6 mg/dL (ref 6–20)
Bilirubin Total: 0.4 mg/dL (ref 0.0–1.2)
CO2: 18 mmol/L — ABNORMAL LOW (ref 20–29)
Calcium: 9.4 mg/dL (ref 8.7–10.2)
Chloride: 105 mmol/L (ref 96–106)
Creatinine, Ser: 0.69 mg/dL (ref 0.57–1.00)
Globulin, Total: 2.3 g/dL (ref 1.5–4.5)
Glucose: 83 mg/dL (ref 65–99)
Potassium: 4.1 mmol/L (ref 3.5–5.2)
Sodium: 141 mmol/L (ref 134–144)
Total Protein: 7.2 g/dL (ref 6.0–8.5)
eGFR: 123 mL/min/{1.73_m2} (ref >59)

## 2020-10-17 LAB — LIPID PANEL WITH LDL/HDL RATIO
Cholesterol, Total: 168 mg/dL (ref 100–199)
HDL: 65 mg/dL (ref >39)
LDL Chol Calc (NIH): 88 mg/dL (ref 0–99)
LDL/HDL Ratio: 1.4 ratio (ref 0.0–3.2)
Triglycerides: 80 mg/dL (ref 0–149)
VLDL Cholesterol Cal: 15 mg/dL (ref 5–40)

## 2020-10-17 LAB — CBC WITH DIFFERENTIAL/PLATELET
Basophils Absolute: 0 10*3/uL (ref 0.0–0.2)
Basos: 1 %
EOS (ABSOLUTE): 0.1 10*3/uL (ref 0.0–0.4)
Eos: 2 %
Hematocrit: 44.8 % (ref 34.0–46.6)
Hemoglobin: 15.2 g/dL (ref 11.1–15.9)
Immature Grans (Abs): 0 10*3/uL (ref 0.0–0.1)
Immature Granulocytes: 0 %
Lymphocytes Absolute: 1.8 10*3/uL (ref 0.7–3.1)
Lymphs: 34 %
MCH: 30.8 pg (ref 26.6–33.0)
MCHC: 33.9 g/dL (ref 31.5–35.7)
MCV: 91 fL (ref 79–97)
Monocytes Absolute: 0.4 10*3/uL (ref 0.1–0.9)
Monocytes: 7 %
Neutrophils Absolute: 3 10*3/uL (ref 1.4–7.0)
Neutrophils: 56 %
Platelets: 286 10*3/uL (ref 150–450)
RBC: 4.93 x10E6/uL (ref 3.77–5.28)
RDW: 11.6 % — ABNORMAL LOW (ref 11.7–15.4)
WBC: 5.3 10*3/uL (ref 3.4–10.8)

## 2020-10-17 LAB — TSH+FREE T4
Free T4: 1.18 ng/dL (ref 0.82–1.77)
TSH: 1.62 u[IU]/mL (ref 0.450–4.500)

## 2020-10-18 LAB — UA/M W/RFLX CULTURE, ROUTINE
Bilirubin, UA: NEGATIVE
Glucose, UA: NEGATIVE
Ketones, UA: NEGATIVE
Nitrite, UA: NEGATIVE
Protein,UA: NEGATIVE
RBC, UA: NEGATIVE
Specific Gravity, UA: <=1.005 — AB (ref 1.005–1.030)
Urobilinogen, Ur: 0.2 mg/dL (ref 0.2–1.0)
pH, UA: 7 (ref 5.0–7.5)

## 2020-10-18 LAB — URINE CULTURE, REFLEX: Organism ID, Bacteria: NO GROWTH

## 2020-10-18 LAB — MICROSCOPIC EXAMINATION
Bacteria, UA: NONE SEEN
Casts: NONE SEEN /lpf
RBC, Urine: NONE SEEN /hpf (ref 0–2)

## 2020-10-21 LAB — IGE FOOD W/COMPONENT REFLEX II
Allergen Corn, IgE: <0.1 kU/L
Clam IgE: <0.1 kU/L
Codfish IgE: <0.1 kU/L
F001-IgE Egg White: <0.1 kU/L
F002-IgE Milk: <0.1 kU/L
F017-IgE Hazelnut (Filbert): <0.1 kU/L
F018-IgE Brazil Nut: <0.1 kU/L
F020-IgE Almond: <0.1 kU/L
F202-IgE Cashew Nut: <0.1 kU/L
F203-IgE Pistachio Nut: <0.1 kU/L
F256-IgE Walnut: <0.1 kU/L
Macadamia Nut, IgE: <0.1 kU/L
Peanut, IgE: <0.1 kU/L
Pecan Nut IgE: <0.1 kU/L
Scallop IgE: <0.1 kU/L
Sesame Seed IgE: <0.1 kU/L
Shrimp IgE: <0.1 kU/L
Soybean IgE: <0.1 kU/L
Wheat IgE: <0.1 kU/L

## 2020-10-22 NOTE — Progress Notes (Signed)
Please call and let her know that her allergy testing came back negative.

## 2020-10-30 ENCOUNTER — Encounter: Payer: Self-pay | Admitting: Hospice and Palliative Medicine

## 2020-10-30 ENCOUNTER — Other Ambulatory Visit: Payer: Self-pay | Admitting: Hospice and Palliative Medicine

## 2020-10-30 MED ORDER — AMOXICILLIN-POT CLAVULANATE 875-125 MG PO TABS: 1.0000 | ORAL_TABLET | Freq: Two times a day (BID) | ORAL | 0 refills | Status: DC

## 2020-12-03 ENCOUNTER — Encounter: Payer: Self-pay | Admitting: Nurse Practitioner

## 2020-12-03 ENCOUNTER — Ambulatory Visit: Payer: BC Managed Care – PPO | Admitting: Nurse Practitioner

## 2020-12-03 ENCOUNTER — Other Ambulatory Visit: Payer: Self-pay

## 2020-12-03 VITALS — BP 128/84 | HR 86 | Temp 98.6°F | Resp 16 | Ht 62.0 in | Wt 137.2 lb

## 2020-12-03 DIAGNOSIS — K5909 Other constipation: Secondary | ICD-10-CM

## 2020-12-03 DIAGNOSIS — R1032 Left lower quadrant pain: Secondary | ICD-10-CM | POA: Diagnosis not present

## 2020-12-03 DIAGNOSIS — R1031 Right lower quadrant pain: Secondary | ICD-10-CM | POA: Diagnosis not present

## 2020-12-03 DIAGNOSIS — R110 Nausea: Secondary | ICD-10-CM

## 2020-12-03 NOTE — Progress Notes (Signed)
[facility] [address] [place], [place] [address]  Internal MEDICINE  Office Visit Note  Patient Name: Patricia Wiley  [id number]  [id number]  Date of Service: 12/03/2020  Chief Complaint  Patient presents with  . Acute Home Visit    Hx of stomach issues, constipation, periods are very heavy     HPI Patricia Wiley is here for a sick visit. She has had GI problems for approximately 3 years. She has been to 1 gastroenterologist and she states that "he made me blow into a paper bag and then prescribed some enzymes I think and then kind of brushed me off".  She previously stopped taking Linzess and omeprazole prescribed by GI as she did not feel they were making a difference in her symptoms. She reports chronic constipation, typically she has a BM every 3-4 days if she takes a laxative, otherwise she is unable to have a BM. Her bowel movements are hard in consistency and she denies having any rectal pain when having a BM. She reports constant nausea and often has severe abdominal cramping. She reports fatigue, feeling tired all the time, and bloated. She denies heartburn, loss of appetite, vomiting, diarrhea, or unexplained weight loss. She had labs drawn in April 2022. CMP was normal. CBC is normal. Her TSH and free T4 was normal. She had a food allergy test done in April 2022, it was negative for any food allergies. An abdominal xray and abdominal ultrasound were negative for any abnormalities. H. Pylori breath test was negative on 05/31/2020.    Reports changing her diet to vegetarian, eats a lot of produce. Adequate amount of fiber in diet. She has previously attempted to eliminate gluten from her diet which she could not tell if this helped or not. No family history of inflammatory bowel disease, colorectal cancer, or gastroparesis. May need CT abdomen. May need new GI referral for possible colonoscopy.  -has intense menstrual cramping around her cycle as well. Discussed  treatment of menstrual symptoms with hormonal contraception. She declined this option for now.   Current Medication:  Outpatient Encounter Medications as of 12/03/2020  Medication Sig  . ALPRAZolam (XANAX) 0.25 MG tablet Take 1 tablet (0.25 mg total) by mouth 2 (two) times daily as needed. Half to one tab tab as needed for panic attacks  . [DISCONTINUED] amoxicillin-clavulanate (AUGMENTIN) 875-125 MG tablet Take 1 tablet by mouth 2 (two) times daily. (Patient not taking: Reported on 12/03/2020)  . [DISCONTINUED] fluticasone (FLONASE) 50 MCG/ACT nasal spray Place 1 spray into both nostrils 2 (two) times daily. (Patient not taking: Reported on 12/03/2020)   No facility-administered encounter medications on file as of 12/03/2020.      Medical History: Past Medical History:  Diagnosis Date  . Depression      Vital Signs: BP 128/84   Pulse 86   Temp 98.6 F (37 C)   Resp 16   Ht 5\' 2"  (1.575 m)   Wt 137 lb 3.2 oz (62.2 kg)   SpO2 99%   BMI 25.09 kg/m    Review of Systems  Constitutional: Positive for fatigue. Negative for appetite change, chills, fever and unexpected weight change.  HENT: Negative.   Respiratory: Negative.  Negative for cough, shortness of breath and wheezing.   Cardiovascular: Negative for chest pain.  Gastrointestinal: Positive for abdominal pain, constipation and nausea. Negative for anal bleeding, blood in stool, diarrhea, rectal pain and vomiting.       Bloating, severe abdominal cramps.  Endocrine: Negative for  polydipsia, polyphagia and polyuria.  Musculoskeletal: Negative.   Allergic/Immunologic: Negative for food allergies (previous food allergy testing done).  Neurological: Negative for dizziness, light-headedness and headaches.  Psychiatric/Behavioral: Negative.     Physical Exam Constitutional:      General: She is not in acute distress.    Appearance: Normal appearance. She is overweight. She is not ill-appearing.  Cardiovascular:     Rate  and Rhythm: Normal rate and regular rhythm.     Pulses: Normal pulses.     Heart sounds: Normal heart sounds.  Pulmonary:     Effort: Pulmonary effort is normal.     Breath sounds: Normal breath sounds.  Abdominal:     General: Bowel sounds are normal. There is no distension.     Palpations: Abdomen is soft. There is no mass.     Tenderness: There is no abdominal tenderness. There is no guarding or rebound.     Hernia: No hernia is present.  Neurological:     Mental Status: She is alert and oriented to person, place, and time.  Psychiatric:        Mood and Affect: Mood normal.        Behavior: Behavior normal.    Assessment/Plan: 1. Chronic constipation Chronic constipation x3 years. Unable to have a BM without taking a laxative. Xray and [place] negative. H.pylori breath test negative and baseline labs negative. Negative for food allergies. Labs to check pancreatic enzymes ordered. CT Abdomen to rule out hernia, or other structural abnormality and GI referral ordered for possible colonoscopy and for second opinion. Referral ordered for [facility] gastroenterology per patient request.  - Lipase - Amylase - Ambulatory referral to Gastroenterology - CT Abdomen Pelvis Wo Contrast; Future  2. Nausea Chronic nausea that seems to be associated with the constipation. No vomiting or loss of appetite - Lipase - Amylase - Ambulatory referral to Gastroenterology  3. Bilateral lower abdominal cramping She has cramping related to the constipation as well as severe menstrual cramping with her cycle. No heavy menstrual flow just severe cramping.  - Ambulatory referral to Gastroenterology - CT Abdomen Pelvis Wo Contrast; Future   General Counseling: Patricia Wiley verbalizes understanding of the findings of todays visit and agrees with plan of treatment. I have discussed any further diagnostic evaluation that may be needed or ordered today. We also reviewed her medications today. she has been encouraged to call  the office with any questions or concerns that should arise related to todays visit.    Counseling:    Orders Placed This Encounter  Procedures  . CT Abdomen Pelvis Wo Contrast  . Lipase  . Amylase  . Ambulatory referral to Gastroenterology    No orders of the defined types were placed in this encounter.  Return if symptoms worsen or fail to improve.  [place] Controlled Substance Database was reviewed by me.  Time spent:30 Minutes This patient was seen by Sallyanne Kuster, FNP-C in collaboration with Dr. Beverely Risen as a part of collaborative care agreement.

## 2020-12-07 ENCOUNTER — Encounter: Payer: Self-pay | Admitting: Nurse Practitioner

## 2020-12-07 NOTE — Telephone Encounter (Signed)
Please see this

## 2020-12-19 ENCOUNTER — Ambulatory Visit
Admission: RE | Admit: 2020-12-19 | Discharge: 2020-12-19 | Disposition: A | Discharge status: Home / Self Care | Payer: BC Managed Care – PPO | Source: Ambulatory Visit | Attending: Nurse Practitioner | Admitting: Nurse Practitioner

## 2020-12-19 DIAGNOSIS — K5909 Other constipation: Secondary | ICD-10-CM

## 2020-12-19 DIAGNOSIS — R1031 Right lower quadrant pain: Secondary | ICD-10-CM

## 2020-12-19 DIAGNOSIS — K5900 Constipation, unspecified: Secondary | ICD-10-CM | POA: Diagnosis not present

## 2020-12-19 DIAGNOSIS — R1032 Left lower quadrant pain: Secondary | ICD-10-CM

## 2021-02-13 ENCOUNTER — Other Ambulatory Visit: Payer: Self-pay

## 2021-02-13 ENCOUNTER — Encounter: Payer: Self-pay | Admitting: Gastroenterology

## 2021-02-13 ENCOUNTER — Ambulatory Visit (INDEPENDENT_AMBULATORY_CARE_PROVIDER_SITE_OTHER): Payer: BC Managed Care – PPO | Admitting: Gastroenterology

## 2021-02-13 ENCOUNTER — Encounter: Payer: Self-pay | Admitting: *Deleted

## 2021-02-13 VITALS — BP 117/78 | HR 90 | Temp 98.1°F | Ht 62.0 in | Wt 136.2 lb

## 2021-02-13 DIAGNOSIS — K5900 Constipation, unspecified: Secondary | ICD-10-CM

## 2021-02-13 MED ORDER — NA SULFATE-K SULFATE-MG SULF 17.5-3.13-1.6 GM/177ML PO SOLN: 354.0000 mL | Freq: Once | ORAL | 0 refills | Status: AC

## 2021-02-13 NOTE — Progress Notes (Signed)
Patricia Mood MD, MRCP(U.K) [address]  [address]  [place], [place] [address]  Main: [phone]  Fax: [phone]   Gastroenterology Consultation  Referring Provider:     Sallyanne Kuster, NP Primary Care Physician:  Lyndon Code, MD Primary Gastroenterologist:  Dr. Wyline Wiley  Reason for Consultation: Transfer of care for chronic constipation        HPI:   Patricia Wiley is a 25 y.o. y/o female referred for consultation & management  by Dr. Welton Flakes, Shannan Harper, MD.    She has been referred to see me with transfer of care previously seen by Dr. Maximino Greenland.  History of chronic constipation fatigue bloating and abdominal cramping for over 3 years.  Has a bowel movement every 3 to 4 days.  When she was seen in November 2021 was commenced on Linzess.,  Had an H. pylori breath test which was negative.  TSH is normal.   She states that she has suffered from constipation for over 3 to 4 years.  No prior GI evaluation with times of procedures.  Sometimes does not have a bowel movement for over 5 days unless she takes a laxative.  And even when she does take the laxative has very hard stools.  She does consume some fiber in her diet.  She notices that when she does not have a good bowel movement has abdominal distention discomfort bloating and when she bends over has features of heartburn.  She tried Linzess which worked for 3 weeks and then stopped working.   Past Medical History:  Diagnosis Date   Depression     History reviewed. No pertinent surgical history.  Prior to Admission medications   Medication Sig Start Date End Date Taking? Authorizing Provider  ALPRAZolam (XANAX) 0.25 MG tablet Take 1 tablet (0.25 mg total) by mouth 2 (two) times daily as needed. Half to one tab tab as needed for panic attacks 11/07/19   Johnna Acosta, NP    Family History  Problem Relation Age of Onset   Heart disease Mother      Social History   Tobacco Use   Smoking status: Every Day     Packs/day: 1.00    Types: Cigarettes   Smokeless tobacco: Never  Vaping Use   Vaping Use: Never used  Substance Use Topics   Alcohol use: Yes    Comment: occasionaly   Drug use: No    Allergies as of 02/13/2021   (No Known Allergies)    Review of Systems:    All systems reviewed and negative except where noted in HPI.   Physical Exam:  BP 117/78   Pulse 90   Temp 98.1 F (36.7 C) (Oral)   Ht 5\' 2"  (1.575 m)   Wt 136 lb 3.2 oz (61.8 kg)   BMI 24.91 kg/m  No LMP recorded. Psych:  Alert and cooperative. Normal Wiley and affect. General:   Alert,  Well-developed, well-nourished, pleasant and cooperative in NAD Head:  Normocephalic and atraumatic. Eyes:  Sclera clear, no icterus.   Conjunctiva pink. Ears:  Normal auditory acuity. Nose:  No deformity, discharge, or lesions. Abdomen:  Normal bowel sounds.  No bruits.  Soft, non-tender and non-distended without masses, hepatosplenomegaly or hernias noted.  No guarding or rebound tenderness.    Neurologic:  Alert and oriented x3;  grossly normal neurologically. Psych:  Alert and cooperative. Normal Wiley and affect.  Imaging Studies: No results found.  Assessment and Plan:   A  Wiley is a 25 y.o. y/o female has been referred for chronic constipation.  Appears she has failed Linzess in the past.  I discussed with her the etiology for her constipation.  Suggested we increase her dietary fiber to 25 to 30 g of fiber per day.  She can use a supplement in addition if she is not meeting her goals from her diet.  Provided her the [facility] patient information about the same.  I suggested we proceed with a colonoscopy to rule out any structural abnormalities of her colon.  In addition discussed about trying to have a bowel movement at the same day in terms of toilet training habits.  We will commence her on Trulance.  Provided her 12 days of samples.  If it works we will give her a prescription.  I have discussed  alternative options, risks & benefits,  which include, but are not limited to, bleeding, infection, perforation,respiratory complication & drug reaction.  The patient agrees with this plan & written consent will be obtained.     Follow up in 8 to 12 weeks  Dr Patricia Mood MD,MRCP(U.K)

## 2021-02-13 NOTE — Patient Instructions (Addendum)
Please give [place] a call to let us know if Trulance is helping you so we could write you a prescription.  If by the 12th week you are feeling better, please call our office to let us know and that way we could cancel your appointment.

## 2021-02-21 DIAGNOSIS — Z20822 Contact with and (suspected) exposure to covid-19: Secondary | ICD-10-CM | POA: Diagnosis not present

## 2021-03-04 ENCOUNTER — Other Ambulatory Visit: Payer: Self-pay

## 2021-03-04 MED ORDER — CLENPIQ 10-3.5-12 MG-GM -GM/160ML PO SOLN: ORAL | 0 refills | Status: DC

## 2021-03-07 ENCOUNTER — Encounter: Payer: Self-pay | Admitting: Gastroenterology

## 2021-03-08 ENCOUNTER — Ambulatory Visit
Admission: RE | Admit: 2021-03-08 | Discharge: 2021-03-08 | Disposition: A | Discharge status: Home / Self Care | Payer: BC Managed Care – PPO | Attending: Gastroenterology | Admitting: Gastroenterology

## 2021-03-08 ENCOUNTER — Ambulatory Visit: Payer: BC Managed Care – PPO | Admitting: Certified Registered Nurse Anesthetist

## 2021-03-08 ENCOUNTER — Encounter
Admission: RE | Disposition: A | Discharge status: Home / Self Care | Payer: Self-pay | Source: Home / Self Care | Attending: Gastroenterology

## 2021-03-08 ENCOUNTER — Encounter: Payer: Self-pay | Admitting: Gastroenterology

## 2021-03-08 DIAGNOSIS — K5900 Constipation, unspecified: Secondary | ICD-10-CM | POA: Diagnosis not present

## 2021-03-08 DIAGNOSIS — K635 Polyp of colon: Secondary | ICD-10-CM | POA: Diagnosis not present

## 2021-03-08 DIAGNOSIS — F17210 Nicotine dependence, cigarettes, uncomplicated: Secondary | ICD-10-CM | POA: Diagnosis not present

## 2021-03-08 HISTORY — PX: COLONOSCOPY WITH PROPOFOL: SHX5780

## 2021-03-08 LAB — POCT PREGNANCY, URINE: Preg Test, Ur: NEGATIVE

## 2021-03-08 SURGERY — COLONOSCOPY WITH PROPOFOL
Anesthesia: General

## 2021-03-08 MED ORDER — PROPOFOL 500 MG/50ML IV EMUL
INTRAVENOUS | Status: AC
  Filled 2021-03-08: qty 50

## 2021-03-08 MED ORDER — SODIUM CHLORIDE 0.9 % IV SOLN: INTRAVENOUS | Status: DC

## 2021-03-08 MED ORDER — DEXMEDETOMIDINE (PRECEDEX) IN NS 20 MCG/5ML (4 MCG/ML) IV SYRINGE
PREFILLED_SYRINGE | INTRAVENOUS | Status: DC | PRN
  Administered 2021-03-08: 12 ug via INTRAVENOUS

## 2021-03-08 MED ORDER — PROPOFOL 500 MG/50ML IV EMUL
INTRAVENOUS | Status: DC | PRN
  Administered 2021-03-08: 175 ug/kg/min via INTRAVENOUS

## 2021-03-08 MED ORDER — PROPOFOL 10 MG/ML IV BOLUS
INTRAVENOUS | Status: DC | PRN
  Administered 2021-03-08: 100 mg via INTRAVENOUS
  Administered 2021-03-08: 30 mg via INTRAVENOUS
  Administered 2021-03-08 (×2): 20 mg via INTRAVENOUS

## 2021-03-08 MED ORDER — LIDOCAINE HCL (CARDIAC) PF 100 MG/5ML IV SOSY
PREFILLED_SYRINGE | INTRAVENOUS | Status: DC | PRN
  Administered 2021-03-08: 40 mg via INTRAVENOUS

## 2021-03-08 NOTE — Op Note (Signed)
[facility] Gastroenterology Patient Name: Patricia Wiley Procedure Date: 03/08/2021 9:53 AM MRN: [id number] Account #: [id number] Date of Birth: 02-May-1996 Admit Type: Outpatient Age: 25 Room: [facility] ENDO ROOM 4 Gender: Female Note Status: Finalized Procedure:             Colonoscopy Indications:           Constipation Providers:             [name] MD, MD Referring MD:          Lyndon Code, MD (Referring MD) Medicines:             Monitored Anesthesia Care Complications:         No immediate complications. Procedure:             Pre-Anesthesia Assessment:                        - Prior to the procedure, a History and Physical was                         performed, and patient medications, allergies and                         sensitivities were reviewed. The patient's tolerance                         of previous anesthesia was reviewed.                        - The risks and benefits of the procedure and the                         sedation options and risks were discussed with the                         patient. All questions were answered and informed                         consent was obtained.                        - ASA Grade Assessment: II - A patient with mild                         systemic disease.                        After obtaining informed consent, the colonoscope was                         passed under direct vision. Throughout the procedure,                         the patient's blood pressure, pulse, and oxygen                         saturations were monitored continuously. The                         Colonoscope was introduced through the anus and  advanced to the the cecum, identified by the                         appendiceal orifice. The colonoscopy was performed                         with ease. The patient tolerated the procedure well.                         The quality of the bowel preparation was  excellent. Findings:      The perianal and digital rectal examinations were normal.      A 10 mm polyp was found in the transverse colon. The polyp was sessile.       The polyp was removed with a cold snare. Resection and retrieval were       complete.      The exam was otherwise without abnormality on direct and retroflexion       views. Impression:            - One 10 mm polyp in the transverse colon, removed                         with a cold snare. Resected and retrieved.                        - The examination was otherwise normal on direct and                         retroflexion views. Recommendation:        - Discharge patient to home (with escort).                        - Resume previous diet.                        - Continue present medications.                        - Await pathology results.                        - Repeat colonoscopy for surveillance based on                         pathology results.                        - Return to GI office as previously scheduled. Procedure Code(s):     --- Professional ---                        [phone], Colonoscopy, flexible; with removal of                         tumor(s), polyp(s), or other lesion(s) by snare                         technique Diagnosis Code(s):     --- Professional ---  K63.5, Polyp of colon                        K59.00, Constipation, unspecified CPT copyright 2019 American Medical Association. All rights reserved. The codes documented in this report are preliminary and upon coder review may  be revised to meet current compliance requirements. [name], MD [name] MD, MD 03/08/2021 10:12:50 AM This report has been signed electronically. Number of Addenda: 0 Note Initiated On: 03/08/2021 9:53 AM Scope Withdrawal Time: 0 hours 8 minutes 39 seconds  Total Procedure Duration: 0 hours 11 minutes 24 seconds  Estimated Blood Loss:  Estimated blood loss: none.      [facility]

## 2021-03-08 NOTE — Anesthesia Preprocedure Evaluation (Signed)
Anesthesia Evaluation  Patient identified by MRN, date of birth, ID band Patient awake    Reviewed: Allergy & Precautions, NPO status , Patient's Chart, lab work & pertinent test results  Airway Mallampati: II  TM Distance: >3 FB Neck ROM: Full    Dental no notable dental hx.    Pulmonary neg pulmonary ROS, Current Smoker and Patient abstained from smoking.,    Pulmonary exam normal        Cardiovascular negative cardio ROS Normal cardiovascular exam     Neuro/Psych PSYCHIATRIC DISORDERS Depression negative neurological ROS     GI/Hepatic negative GI ROS, Neg liver ROS,   Endo/Other  negative endocrine ROS  Renal/GU negative Renal ROS  negative genitourinary   Musculoskeletal negative musculoskeletal ROS (+)   Abdominal   Peds negative pediatric ROS (+)  Hematology negative hematology ROS (+)   Anesthesia Other Findings   Reproductive/Obstetrics negative OB ROS                             Anesthesia Physical Anesthesia Plan  ASA: 2  Anesthesia Plan: General   Post-op Pain Management:    Induction: Intravenous  PONV Risk Score and Plan: 2 and Propofol infusion and TIVA  Airway Management Planned: Natural Airway and Nasal Cannula  Additional Equipment:   Intra-op Plan:   Post-operative Plan:   Informed Consent: I have reviewed the patients History and Physical, chart, labs and discussed the procedure including the risks, benefits and alternatives for the proposed anesthesia with the patient or authorized representative who has indicated his/her understanding and acceptance.       Plan Discussed with: CRNA, Anesthesiologist and Surgeon  Anesthesia Plan Comments:         Anesthesia Quick Evaluation

## 2021-03-08 NOTE — Anesthesia Procedure Notes (Signed)
Date/Time: 03/08/2021 9:54 AM Performed by: Stormy Fabian, CRNA Pre-anesthesia Checklist: Patient identified, Emergency Drugs available, Suction available and Patient being monitored Patient Re-evaluated:Patient Re-evaluated prior to induction Oxygen Delivery Method: Nasal cannula Induction Type: IV induction Dental Injury: Teeth and Oropharynx as per pre-operative assessment  Comments: Nasal cannula with etCO2 monitoring

## 2021-03-08 NOTE — H&P (Signed)
     [name], MD [address], [address], [place], [place], [address] [address], [place], [place], [address] Phone: [phone]  Fax: [phone]  Primary Care Physician:  Lyndon Code, MD   Pre-Procedure History & Physical: HPI:  Patricia Wiley is a 25 y.o. female is here for an colonoscopy.   Past Medical History:  Diagnosis Date   Depression     History reviewed. No pertinent surgical history.  Prior to Admission medications   Medication Sig Start Date End Date Taking? Authorizing Provider  ALPRAZolam (XANAX) 0.25 MG tablet Take 1 tablet (0.25 mg total) by mouth 2 (two) times daily as needed. Half to one tab tab as needed for panic attacks Patient not taking: Reported on 02/13/2021 11/07/19   Johnna Acosta, NP  Sod Picosulfate-Mag Ox-Cit Acd (CLENPIQ) 10-3.5-12 MG-GM -GM/160ML SOLN Take 1 bottle at 5 PM followed by five 8 oz cups of water and repeat 5 hours before procedure. 03/04/21   [name], MD    Allergies as of 02/13/2021   (No Known Allergies)    Family History  Problem Relation Age of Onset   Heart disease Mother     Social History   Socioeconomic History   Marital status: Single    Spouse name: Not on file   Number of children: Not on file   Years of education: Not on file   Highest education level: Not on file  Occupational History   Not on file  Tobacco Use   Smoking status: Every Day    Packs/day: 1.00    Types: Cigarettes   Smokeless tobacco: Never  Vaping Use   Vaping Use: Never used  Substance and Sexual Activity   Alcohol use: Yes    Comment: occasionaly   Drug use: No   Sexual activity: Yes    Birth control/protection: None  Other Topics Concern   Not on file  Social History Narrative   Not on file   Social Determinants of Health   Financial Resource Strain: Not on file  Food Insecurity: Not on file  Transportation Needs: Not on file  Physical Activity: Not on file  Stress: Not on file  Social  Connections: Not on file  Intimate Partner Violence: Not on file    Review of Systems: See HPI, otherwise negative ROS  Physical Exam: There were no vitals taken for this visit. General:   Alert,  pleasant and cooperative in NAD Head:  Normocephalic and atraumatic. Neck:  Supple; no masses or thyromegaly. Lungs:  Clear throughout to auscultation, normal respiratory effort.    Heart:  +S1, +S2, Regular rate and rhythm, No edema. Abdomen:  Soft, nontender and nondistended. Normal bowel sounds, without guarding, and without rebound.   Neurologic:  Alert and  oriented x4;  grossly normal neurologically.  Impression/Plan: Patricia Wiley is here for an colonoscopy to be performed for constipation  Risks, benefits, limitations, and alternatives regarding  colonoscopy have been reviewed with the patient.  Questions have been answered.  All parties agreeable.   [name], MD  03/08/2021, 9:29 AM

## 2021-03-08 NOTE — Anesthesia Postprocedure Evaluation (Signed)
Anesthesia Post Note  Patient: Patricia Wiley  Procedure(s) Performed: COLONOSCOPY WITH PROPOFOL  Patient location during evaluation: Phase II Anesthesia Type: General Level of consciousness: awake and alert, awake and oriented Pain management: pain level controlled Vital Signs Assessment: post-procedure vital signs reviewed and stable Respiratory status: spontaneous breathing, nonlabored ventilation and respiratory function stable Cardiovascular status: blood pressure returned to baseline and stable Postop Assessment: no apparent nausea or vomiting Anesthetic complications: no   No notable events documented.   Last Vitals:  Vitals:   03/08/21 1030 03/08/21 1034  BP:  102/64  Pulse:    Resp: 19 20  Temp:    SpO2:      Last Pain:  Vitals:   03/08/21 1015  TempSrc: Temporal  PainSc:                  Manfred Arch

## 2021-03-08 NOTE — Transfer of Care (Signed)
Immediate Anesthesia Transfer of Care Note  Patient: Patricia Wiley  Procedure(s) Performed: COLONOSCOPY WITH PROPOFOL  Patient Location: PACU and Endoscopy Unit  Anesthesia Type:General  Level of Consciousness: drowsy  Airway & Oxygen Therapy: Patient Spontanous Breathing  Post-op Assessment: Report given to RN and Post -op Vital signs reviewed and stable  Post vital signs: Reviewed and stable  Last Vitals:  Vitals Value Taken Time  BP 93/58 03/08/21 1015  Temp    Pulse 95 03/08/21 1015  Resp 15 03/08/21 1015  SpO2 100 % 03/08/21 1015  Vitals shown include unvalidated device data.  Last Pain:  Vitals:   03/08/21 0935  TempSrc: Temporal  PainSc: 0-No pain         Complications: No notable events documented.

## 2021-03-09 NOTE — Progress Notes (Signed)
Non-identified Voicemail.  No Message Left. 

## 2021-03-11 ENCOUNTER — Encounter: Payer: Self-pay | Admitting: Gastroenterology

## 2021-03-11 LAB — SURGICAL PATHOLOGY

## 2021-03-12 ENCOUNTER — Encounter: Payer: Self-pay | Admitting: Gastroenterology

## 2021-03-19 ENCOUNTER — Telehealth: Payer: Self-pay | Admitting: Gastroenterology

## 2021-03-19 NOTE — Telephone Encounter (Signed)
Trulance is giving her extremely watery diarrhea. She feels like its too strong. Please advise. Likes that she is not constipated.

## 2021-03-28 NOTE — Telephone Encounter (Signed)
Patient had been called twice and had to leave her a voicemail. I went ahead and sent her a MyChart message.

## 2021-04-01 ENCOUNTER — Other Ambulatory Visit
Admission: RE | Admit: 2021-04-01 | Discharge: 2021-04-01 | Disposition: A | Discharge status: Home / Self Care | Payer: BC Managed Care – PPO | Source: Ambulatory Visit | Attending: Student | Admitting: Student

## 2021-04-01 DIAGNOSIS — Z03818 Encounter for observation for suspected exposure to other biological agents ruled out: Secondary | ICD-10-CM | POA: Diagnosis not present

## 2021-04-01 DIAGNOSIS — R0602 Shortness of breath: Secondary | ICD-10-CM | POA: Insufficient documentation

## 2021-04-01 DIAGNOSIS — J329 Chronic sinusitis, unspecified: Secondary | ICD-10-CM | POA: Diagnosis not present

## 2021-04-01 DIAGNOSIS — R079 Chest pain, unspecified: Secondary | ICD-10-CM | POA: Insufficient documentation

## 2021-04-01 LAB — TROPONIN I (HIGH SENSITIVITY): Troponin I (High Sensitivity): <2 ng/L (ref <18)

## 2021-04-01 LAB — D-DIMER, QUANTITATIVE: D-Dimer, Quant: <0.27 ug/mL-FEU (ref 0.00–0.50)

## 2021-04-15 ENCOUNTER — Ambulatory Visit: Payer: BC Managed Care – PPO | Admitting: Nurse Practitioner

## 2021-04-22 ENCOUNTER — Other Ambulatory Visit: Payer: Self-pay

## 2021-04-22 MED ORDER — TRULANCE 3 MG PO TABS: 1.0000 | ORAL_TABLET | Freq: Every day | ORAL | 3 refills | Status: DC

## 2021-04-25 DIAGNOSIS — L700 Acne vulgaris: Secondary | ICD-10-CM | POA: Diagnosis not present

## 2021-04-25 DIAGNOSIS — Z3202 Encounter for pregnancy test, result negative: Secondary | ICD-10-CM | POA: Diagnosis not present

## 2021-05-08 ENCOUNTER — Ambulatory Visit: Payer: BC Managed Care – PPO | Admitting: Gastroenterology

## 2021-10-02 ENCOUNTER — Other Ambulatory Visit: Payer: Self-pay

## 2021-10-02 ENCOUNTER — Other Ambulatory Visit (HOSPITAL_COMMUNITY)
Admission: RE | Admit: 2021-10-02 | Discharge: 2021-10-02 | Disposition: A | Discharge status: Home / Self Care | Payer: BC Managed Care – PPO | Source: Ambulatory Visit | Attending: Certified Nurse Midwife | Admitting: Certified Nurse Midwife

## 2021-10-02 ENCOUNTER — Encounter: Payer: Self-pay | Admitting: Certified Nurse Midwife

## 2021-10-02 ENCOUNTER — Ambulatory Visit (INDEPENDENT_AMBULATORY_CARE_PROVIDER_SITE_OTHER): Payer: Self-pay | Admitting: Certified Nurse Midwife

## 2021-10-02 VITALS — BP 113/76 | HR 80 | Ht 62.0 in | Wt 136.3 lb

## 2021-10-02 DIAGNOSIS — Z01419 Encounter for gynecological examination (general) (routine) without abnormal findings: Secondary | ICD-10-CM | POA: Diagnosis not present

## 2021-10-02 DIAGNOSIS — B3731 Acute candidiasis of vulva and vagina: Secondary | ICD-10-CM | POA: Diagnosis not present

## 2021-10-02 DIAGNOSIS — N898 Other specified noninflammatory disorders of vagina: Secondary | ICD-10-CM

## 2021-10-02 DIAGNOSIS — Z124 Encounter for screening for malignant neoplasm of cervix: Secondary | ICD-10-CM

## 2021-10-02 NOTE — Progress Notes (Signed)
error 

## 2021-10-02 NOTE — Progress Notes (Signed)
? ? ?GYNECOLOGY ANNUAL PREVENTATIVE CARE ENCOUNTER NOTE ? ?History:    ? Patricia Wiley is a 26 y.o. G0P0000 female here for a routine annual gynecologic exam.  Current complaints: vaginal discharge, pt has history of BV, she requests testing today.   Denies abnormal vaginal bleeding, discharge, pelvic pain, problems with intercourse or other gynecologic concerns.  ?  ? ?Social ?Relationship: female partner ?Living: with partner ?Work: Geologist, engineering ?Exercise: 3-5 x wk at gyn ?Smoke/Alcohol/drug use: smokes 1 pack day x 10 yrs, Occasional alcohol use, denies drug use.  ? ?Gynecologic History ?No LMP recorded (lmp unknown). ?Contraception: none ?Last Pap: 11/29/2018. Results were: normal  ?Last mammogram:  n/a  ? [facility] - 10/02/21 1007   ? ?  ? Pregnancy Intention Screening  ? Does the patient want to become pregnant in the next year? No   ? Does the patient's partner want to become pregnant in the next year? No   ? Would the patient like to discuss contraceptive options today? No   ? ?  ?  ? ?  ? ?The pregnancy intention screening data noted above was reviewed. Potential methods of contraception were discussed. The patient elected to proceed with none. ? ? ? ?Obstetric History ?OB History  ?Gravida Para Term Preterm AB Living  ?0 0 0 0 0 0  ?SAB IAB Ectopic Multiple Live Births  ?0 0 0 0 0  ? ? ?Past Medical History:  ?Diagnosis Date  ? Depression   ? ? ?Past Surgical History:  ?Procedure Laterality Date  ? COLONOSCOPY WITH PROPOFOL N/A 03/08/2021  ? Procedure: COLONOSCOPY WITH PROPOFOL;  Surgeon: Jonathon Bellows, MD;  Location: [facility] ENDOSCOPY;  Service: Gastroenterology;  Laterality: N/A;  ? tubes in ears    ? ? ?Current Outpatient Medications on File Prior to Visit  ?Medication Sig Dispense Refill  ? ALPRAZolam (XANAX) 0.25 MG tablet Take 1 tablet (0.25 mg total) by mouth 2 (two) times daily as needed. Half to one tab tab as needed for panic attacks 10 tablet 0  ? Plecanatide (TRULANCE) 3 MG TABS Take 1  tablet by mouth daily. 90 tablet 3  ? Sod Picosulfate-Mag Ox-Cit Acd (CLENPIQ) 10-3.5-12 MG-GM -GM/160ML SOLN Take 1 bottle at 5 PM followed by five 8 oz cups of water and repeat 5 hours before procedure. 320 mL 0  ? ?No current facility-administered medications on file prior to visit.  ? ? ?No Known Allergies ? ?Social History:  reports that she has been smoking cigarettes. She has been smoking an average of 1 pack per day. She has never used smokeless tobacco. She reports current alcohol use. She reports that she does not use drugs. ? ?Family History  ?Problem Relation Age of Onset  ? Heart disease Mother   ? ? ?The following portions of the patient's history were reviewed and updated as appropriate: allergies, current medications, past family history, past medical history, past social history, past surgical history and problem list. ? ?Review of Systems ?Pertinent items noted in HPI and remainder of comprehensive ROS otherwise negative. ? ?Physical Exam:  ?BP 113/76   Pulse 80   Ht 5\' 2"  (1.575 m)   Wt 136 lb 4.8 oz (61.8 kg)   LMP  (LMP Unknown)   BMI 24.93 kg/m?  ?CONSTITUTIONAL: Well-developed, well-nourished female in no acute distress.  ?HENT:  Normocephalic, atraumatic, External right and left ear normal. Oropharynx is clear and moist ?EYES: Conjunctivae and EOM are normal. Pupils are equal, round, and reactive to light.  No scleral icterus.  ?NECK: Normal range of motion, supple, no masses.  Normal thyroid.  ?SKIN: Skin is warm and dry. No rash noted. Not diaphoretic. No erythema. No pallor. ?MUSCULOSKELETAL: Normal range of motion. No tenderness.  No cyanosis, clubbing, or edema.  2+ distal pulses. ?NEUROLOGIC: Alert and oriented to person, place, and time. Normal reflexes, muscle tone coordination.  ?PSYCHIATRIC: Normal mood and affect. Normal behavior. Normal judgment and thought content. ?CARDIOVASCULAR: Normal heart rate noted, regular rhythm ?RESPIRATORY: Clear to auscultation bilaterally. Effort  and breath sounds normal, no problems with respiration noted. ?BREASTS: Symmetric in size. No masses, tenderness, skin changes, nipple drainage, or lymphadenopathy bilaterally.  ?ABDOMEN: Soft, no distention noted.  No tenderness, rebound or guarding.  ?PELVIC: Normal appearing external genitalia and urethral meatus; normal appearing vaginal mucosa and cervix.  No abnormal discharge noted.  Pap smear obtained.  Contact bleeding, swab collected. Normal uterine size, no other palpable masses, no uterine or adnexal tenderness.  . ?  ?Assessment and Plan:  ?  1. Well woman exam with routine gynecological exam ? ?- Cytology - PAP ?- Cervicovaginal ancillary only ? ?2. Screening for cervical cancer ?- Cytology - PAP ? ?3. Vaginal discharge ?- Cervicovaginal ancillary only ? ? ?Pap: Will follow up results of pap smear and manage accordingly. ?Mammogram : n/a  ?Labs: declines, vaginal swab and pap ?Refills: none ?Referral: none ?Routine preventative health maintenance measures emphasized. ?Please refer to After Visit Summary for other counseling recommendations.  ?   ? ?Philip Aspen, CNM ?[facility] ?[facility]   ?

## 2021-10-04 LAB — CERVICOVAGINAL ANCILLARY ONLY
Bacterial Vaginitis (gardnerella): NEGATIVE
Candida Glabrata: NEGATIVE
Candida Vaginitis: POSITIVE — AB
Comment: NEGATIVE
Comment: NEGATIVE
Comment: NEGATIVE

## 2021-10-07 ENCOUNTER — Other Ambulatory Visit: Payer: Self-pay | Admitting: Certified Nurse Midwife

## 2021-10-07 ENCOUNTER — Encounter: Payer: Self-pay | Admitting: Certified Nurse Midwife

## 2021-10-07 LAB — CYTOLOGY - PAP: Diagnosis: NEGATIVE

## 2021-10-07 MED ORDER — FLUCONAZOLE 150 MG PO TABS: 150.0000 mg | ORAL_TABLET | Freq: Every day | ORAL | 1 refills | Status: DC

## 2021-10-13 IMAGING — CT CT ABD-PELV W/O CM
2 of 4 series · 11 of 46 positions shown, 12 images · non-contrast
Comparison: None.

CLINICAL DATA: Chronic constipation and nausea with bilateral lower
abdominal cramping.

EXAM:
CT ABDOMEN AND PELVIS WITHOUT CONTRAST
TECHNIQUE: Multidetector CT imaging of the abdomen and pelvis was performed
following the standard protocol without IV contrast.

[Series 2: routine abdomen pelvis without 5.00 br40 s3 axial · axial · non-contrast · 0.47mm/px · z∈[+1231,+1606]mm · 8 of 89 slices shown, 9 images]
[im 7/89  soft-tissue]
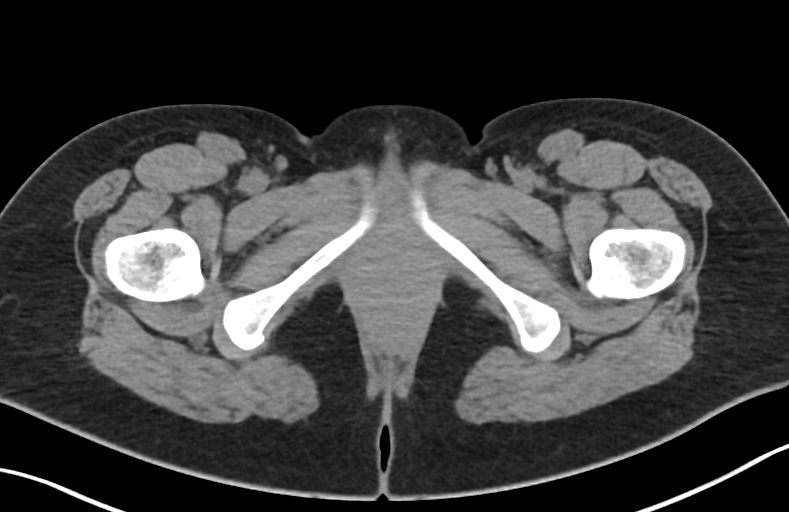
[im 7/89  bone]
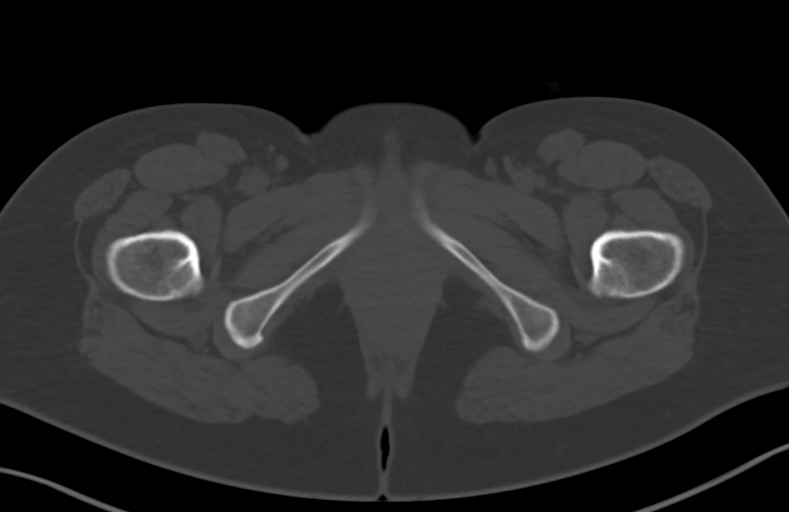
[im 17/89  soft-tissue]
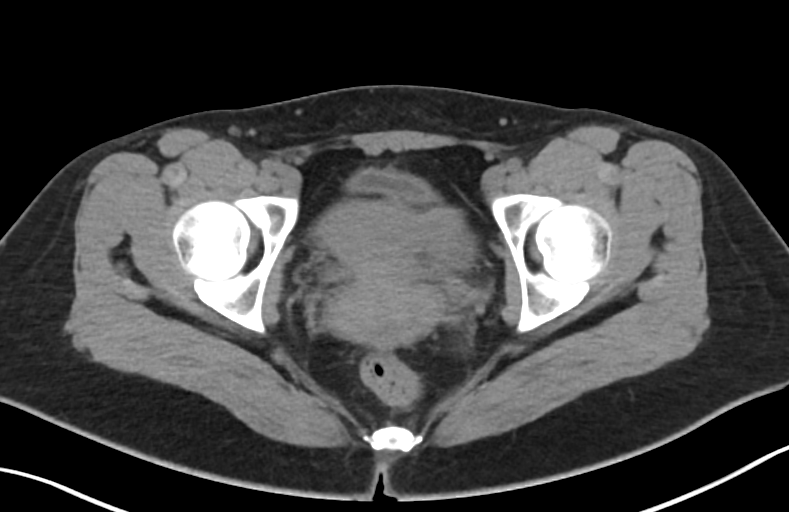
[im 28/89  soft-tissue]
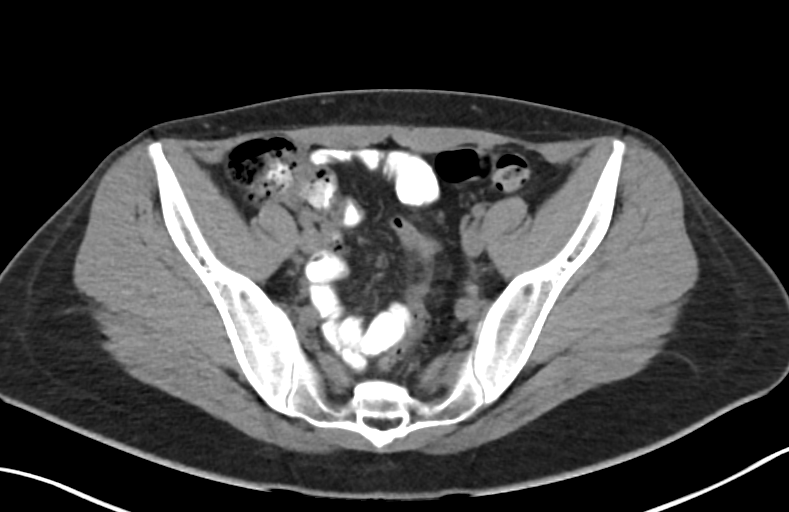
[im 38/89  soft-tissue]
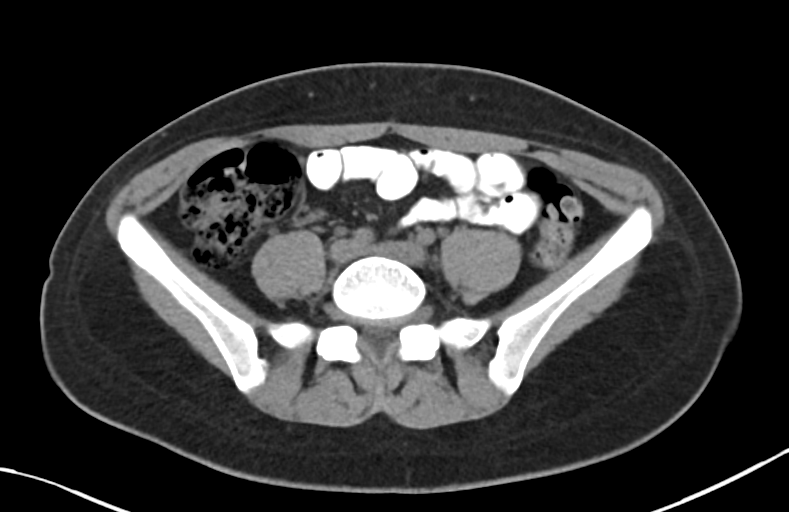
[im 51/89  soft-tissue]
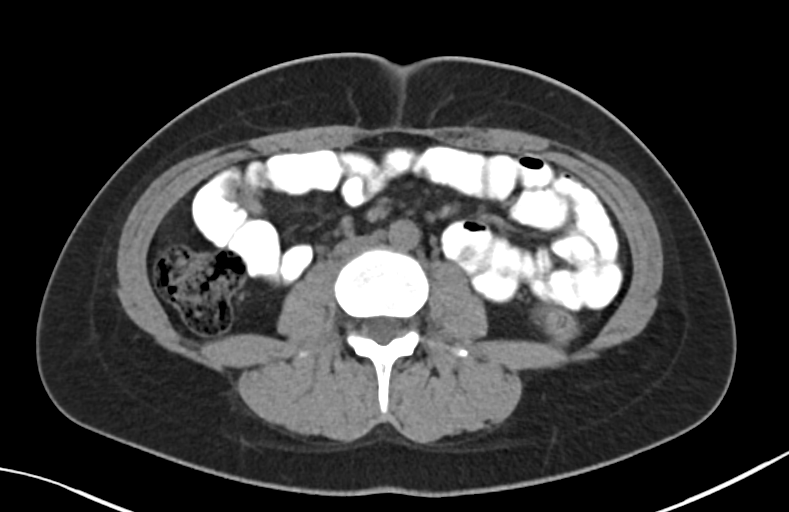
[im 61/89  soft-tissue]
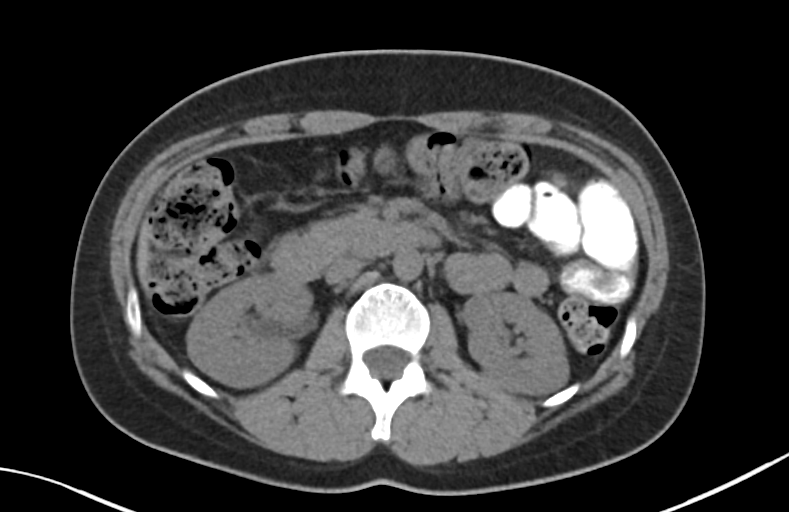
[im 72/89  soft-tissue]
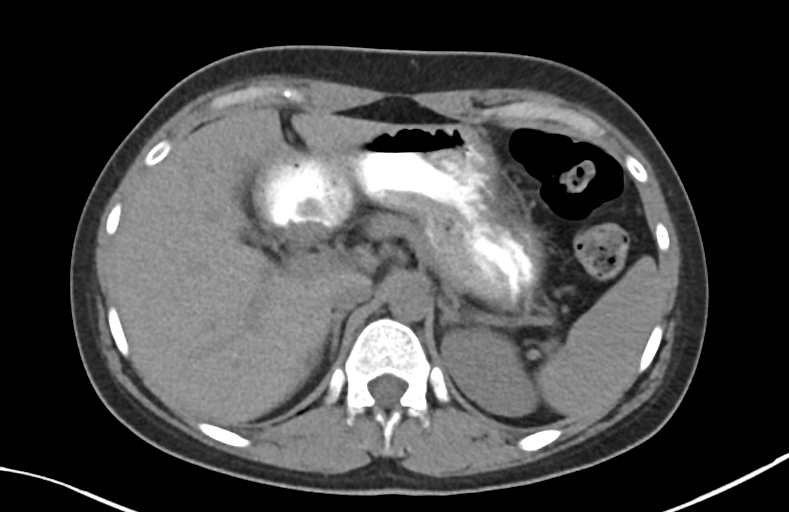
[im 82/89  soft-tissue]
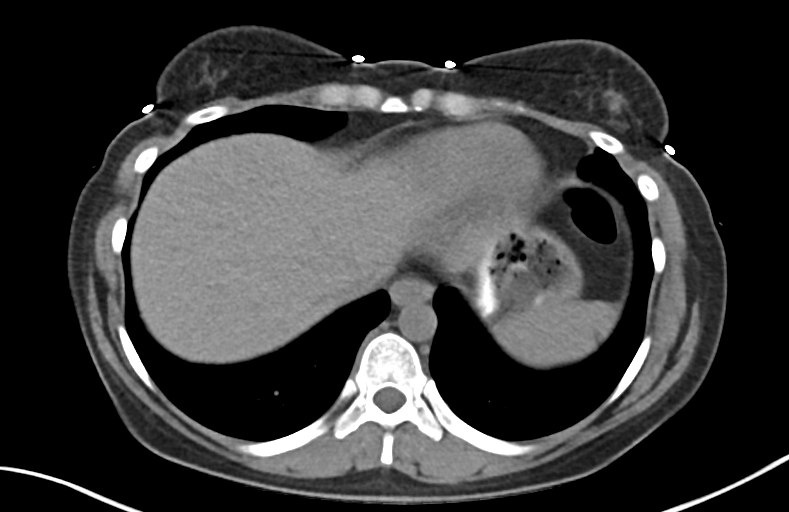

[Series 4: routine abdomen pelvis without 2.00 br40 s3 cor · coronal · non-contrast · 0.73mm/px · 3 of 121 slices shown]
[im 41/121  soft-tissue]
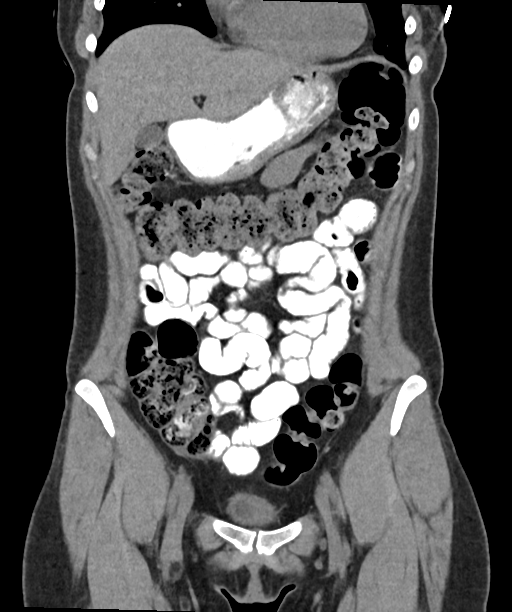
[im 54/121  soft-tissue]
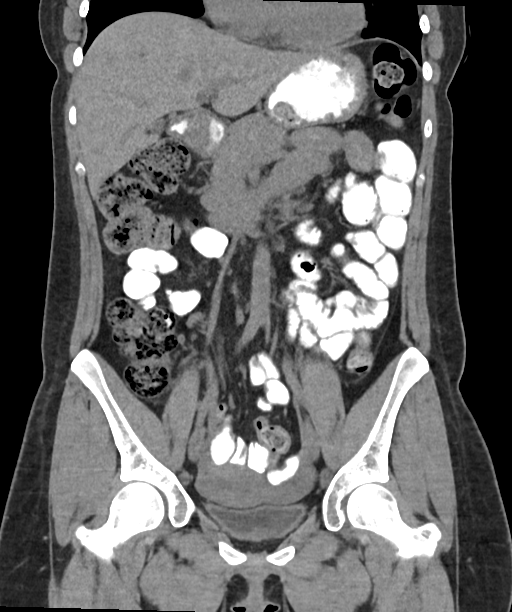
[im 67/121  soft-tissue]
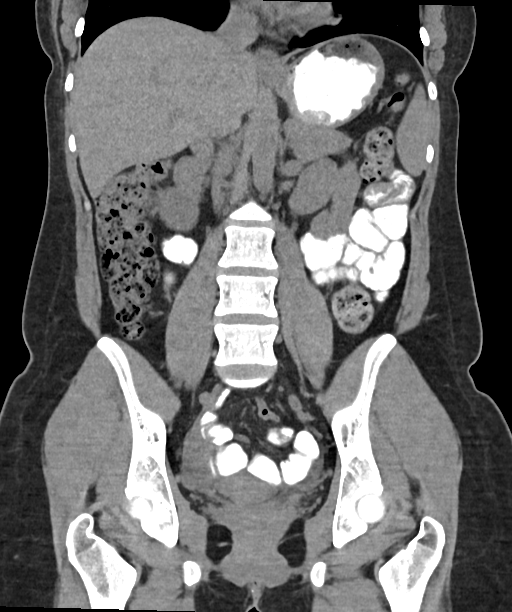

[11 of 46 positions shown; findings below may reference images not displayed]

FINDINGS: Lower chest: Lung bases are normal.

Hepatobiliary: Gallbladder, liver and biliary tree are normal.

Pancreas: Normal.

Spleen: Normal.

Adrenals/Urinary Tract: Adrenal glands are normal. Kidneys are
normal in size without hydronephrosis or nephrolithiasis. Ureters
and bladder are normal.

Stomach/Bowel: Stomach and small bowel are normal. Appendix is
normal. Mild fecal retention throughout the colon which is otherwise
normal.

Vascular/Lymphatic: Abdominal aorta is normal caliber. There is no
evidence of adenopathy.

Reproductive: Normal.

Other: No free fluid or focal inflammatory change.

Musculoskeletal: No focal abnormality.
IMPRESSION: No acute findings in the abdomen/pelvis.

## 2021-10-21 ENCOUNTER — Encounter: Payer: BC Managed Care – PPO | Admitting: Nurse Practitioner

## 2021-12-05 ENCOUNTER — Encounter: Payer: Self-pay | Admitting: Gastroenterology

## 2021-12-05 NOTE — Telephone Encounter (Signed)
Check how many days she has been on trulance and how often is she having a bowel mvoement ?

## 2021-12-06 NOTE — Telephone Encounter (Signed)
Probably best she sees me in the office to discuss this further- probably I can see her on Wednesday since we have had a few cancellations in endo

## 2021-12-11 ENCOUNTER — Other Ambulatory Visit: Payer: Self-pay

## 2021-12-11 ENCOUNTER — Encounter: Payer: Self-pay | Admitting: Gastroenterology

## 2021-12-11 ENCOUNTER — Ambulatory Visit (INDEPENDENT_AMBULATORY_CARE_PROVIDER_SITE_OTHER): Payer: BC Managed Care – PPO | Admitting: Gastroenterology

## 2021-12-11 VITALS — BP 117/74 | HR 74 | Temp 98.9°F | Wt 140.6 lb

## 2021-12-11 DIAGNOSIS — K219 Gastro-esophageal reflux disease without esophagitis: Secondary | ICD-10-CM

## 2021-12-11 DIAGNOSIS — R140 Abdominal distension (gaseous): Secondary | ICD-10-CM | POA: Diagnosis not present

## 2021-12-11 DIAGNOSIS — K5900 Constipation, unspecified: Secondary | ICD-10-CM

## 2021-12-11 NOTE — Progress Notes (Signed)
[name] MD, MRCP(U.K) [address]  [address]  [place], [place] [address]  Main: [phone]  Fax: [phone]   Primary Care Physician: Sallyanne Kuster, NP  Primary Gastroenterologist:  Dr. [name]   Chief Complaint  Patient presents with   Diarrhea    HPI: Patricia Wiley is a 26 y.o. female  She had previously seen me back in 03/02/2022 as a transfer of care from Dr. Maximino Greenland for constipation.  She had failed Linzess and had started on Trulance.  She subsequently had a colonoscopy and a benign polyp was resected.  No other abnormalities were seen in the colon.  She returns today with diarrhea when she takes her Trulance on a daily basis but it does work.  She is going to try taking it every other day.  Recently has also noted occasional episodes of heartburn in the middle of the night which can sometimes last all day long.  Was prescribed Prilosec but has not taken it on a regular basis.  She also complains of gas and bloating.  Does consume some chewing gum and some soda on a daily basis.  No other complaints.  Current Outpatient Medications  Medication Sig Dispense Refill   Plecanatide (TRULANCE) 3 MG TABS Take 1 tablet by mouth daily. 90 tablet 3   ALPRAZolam (XANAX) 0.25 MG tablet Take 1 tablet (0.25 mg total) by mouth 2 (two) times daily as needed. Half to one tab tab as needed for panic attacks (Patient not taking: Reported on 12/11/2021) 10 tablet 0   No current facility-administered medications for this visit.    Allergies as of 12/11/2021 - Review Complete 12/11/2021  Allergen Reaction Noted   Prednisone  04/01/2021    ROS:  General: Negative for anorexia, weight loss, fever, chills, fatigue, weakness. ENT: Negative for hoarseness, difficulty swallowing , nasal congestion. CV: Negative for chest pain, angina, palpitations, dyspnea on exertion, peripheral edema.  Respiratory: Negative for dyspnea at rest, dyspnea on exertion, cough, sputum,  wheezing.  GI: See history of present illness. GU:  Negative for dysuria, hematuria, urinary incontinence, urinary frequency, nocturnal urination.  Endo: Negative for unusual weight change.    Physical Examination:   BP 117/74   Pulse 74   Temp 98.9 F (37.2 C) (Oral)   Wt 140 lb 9.6 oz (63.8 kg)   BMI 25.72 kg/m   General: Well-nourished, well-developed in no acute distress.  Eyes: No icterus. Conjunctivae pink. Neuro: Alert and oriented x 3.  Grossly intact. Skin: Warm and dry, no jaundice.   Psych: Alert and cooperative, normal mood and affect.   Imaging Studies: No results found.  Assessment and Plan:   Patricia Wiley is a 26 y.o. y/o female here to see me today as a follow-up for constipation.  She seems to have developed diarrhea from the effects of Trulance.  It is one of the side effects.  It does help with her constipation significantly.  She has had new symptoms suggestive of reflux and heartburn, in addition has gas and bloating which is probably due to combination of increased fiber in her diet and consumption of foods containing fructose corn syrup and artificial sugars in the form of sodas and chewing gum.  Plan 1.  Continue Trulance but try taking it every other day to reduce the effects of diarrhea if it does not work let us know we will change her to Amitiza. 2.  GERD patient information provided counseled about lifestyle changes including use of  a wedge pillow picture of it has been provided.  Avoid dinner for 2 hours before bedtime.  Commence as a trial on omeprazole 40 mg first thing in the morning on an empty stomach.  We will try changing her to Pepcid if lifestyle changes help and she gets good relief from her PPI. 3.  Gas and bloating: Low FODMAP diet stop all artificial sugars and sweeteners and sodas.  Trial of charcoal tablets to be taken as needed to alleviate symptoms.    Dr [name]  MD,MRCP [facility]) Follow up in 8-16 weeks

## 2021-12-11 NOTE — Patient Instructions (Signed)
Wedge pillow: Food Choices for Gastroesophageal Reflux Disease, Adult When you have gastroesophageal reflux disease (GERD), the foods you eat and your eating habits are very important. Choosing the right foods can help ease your discomfort. Think about working with a food expert (dietitian) to help you make good choices. What are tips for following this plan? Reading food labels Look for foods that are low in saturated fat. Foods that may help with your symptoms include: Foods that have less than 5% of daily value (DV) of fat. Foods that have 0 grams of trans fat. Cooking Do not fry your food. Cook your food by baking, steaming, grilling, or broiling. These are all methods that do not need a lot of fat for cooking. To add flavor, try to use herbs that are low in spice and acidity. Meal planning  Choose healthy foods that are low in fat, such as: Fruits and vegetables. Whole grains. Low-fat dairy products. Lean meats, fish, and poultry. Eat small meals often instead of eating 3 large meals each day. Eat your meals slowly in a place where you are relaxed. Avoid bending over or lying down until 2-3 hours after eating. Limit high-fat foods such as fatty meats or fried foods. Limit your intake of fatty foods, such as oils, butter, and shortening. Avoid the following as told by your doctor: Foods that cause symptoms. These may be different for different people. Keep a food diary to keep track of foods that cause symptoms. Alcohol. Drinking a lot of liquid with meals. Eating meals during the 2-3 hours before bed. Lifestyle Stay at a healthy weight. Ask your doctor what weight is healthy for you. If you need to lose weight, work with your doctor to do so safely. Exercise for at least 30 minutes on 5 or more days each week, or as told by your doctor. Wear loose-fitting clothes. Do not smoke or use any products that contain nicotine or tobacco. If you need help quitting, ask your doctor. Sleep  with the head of your bed higher than your feet. Use a wedge under the mattress or blocks under the bed frame to raise the head of the bed. Chew sugar-free gum after meals. What foods should eat?  Eat a healthy, well-balanced diet of fruits, vegetables, whole grains, low-fat dairy products, lean meats, fish, and poultry. Each person is different. Foods that may cause symptoms in one person may not cause any symptoms in another person. Work with your doctor to find foods that are safe for you. The items listed above may not be a complete list of what you can eat and drink. Contact a food expert for more options. What foods should I avoid? Limiting some of these foods may help in managing the symptoms of GERD. Everyone is different. Talk with a food expert or your doctor to help you find the exact foods to avoid, if any. Fruits Any fruits prepared with added fat. Any fruits that cause symptoms. For some people, this may include citrus fruits, such as oranges, grapefruit, pineapple, and lemons. Vegetables Deep-fried vegetables. [place] fries. Any vegetables prepared with added fat. Any vegetables that cause symptoms. For some people, this may include tomatoes and tomato products, chili peppers, onions and garlic, and horseradish. Grains Pastries or quick breads with added fat. Meats and other proteins High-fat meats, such as fatty beef or pork, hot dogs, ribs, ham, sausage, salami, and bacon. Fried meat or protein, including fried fish and fried chicken. Nuts and nut butters, in large amounts.  Dairy Whole milk and chocolate milk. Sour cream. Cream. Ice cream. Cream cheese. Milkshakes. Fats and oils Butter. Margarine. Shortening. Ghee. Beverages Coffee and tea, with or without caffeine. Carbonated beverages. Sodas. Energy drinks. Fruit juice made with acidic fruits, such as orange or grapefruit. Tomato juice. Alcoholic drinks. Sweets and desserts Chocolate and cocoa. Donuts. Seasonings and  condiments Pepper. Peppermint and spearmint. Added salt. Any condiments, herbs, or seasonings that cause symptoms. For some people, this may include curry, hot sauce, or vinegar-based salad dressings. The items listed above may not be a complete list of what you should not eat and drink. Contact a food expert for more options. Questions to ask your doctor Diet and lifestyle changes are often the first steps that are taken to manage symptoms of GERD. If diet and lifestyle changes do not help, talk with your doctor about taking medicines. Where to find more information International Foundation for Gastrointestinal Disorders: aboutgerd.org Summary When you have GERD, food and lifestyle choices are very important in easing your symptoms. Eat small meals often instead of 3 large meals a day. Eat your meals slowly and in a place where you are relaxed. Avoid bending over or lying down until 2-3 hours after eating. Limit high-fat foods such as fatty meats or fried foods. This information is not intended to replace advice given to you by your health care provider. Make sure you discuss any questions you have with your health care provider. Document Revised: 01/09/2020 Document Reviewed: 01/09/2020 Elsevier Patient Education  [place] stands for fermentable oligosaccharides, disaccharides, monosaccharides, and polyols. These are sugars that are hard for some people to digest. A low-FODMAP eating plan may help some people who have irritable bowel syndrome (IBS) and certain other bowel (intestinal) diseases to manage their symptoms. This meal plan can be complicated to follow. Work with a diet and nutrition specialist (dietitian) to make a low-FODMAP eating plan that is right for you. A dietitian can help make sure that you get enough nutrition from this diet. What are tips for following this plan? Reading food labels Check labels for hidden FODMAPs such  as: High-fructose syrup. Honey. Agave. Natural fruit flavors. Onion or garlic powder. Choose low-FODMAP foods that contain 3-4 grams of fiber per serving. Check food labels for serving sizes. Eat only one serving at a time to make sure FODMAP levels stay low. Shopping Shop with a list of foods that are recommended on this diet and make a meal plan. Meal planning Follow a low-FODMAP eating plan for up to 6 weeks, or as told by your health care provider or dietitian. To follow the eating plan: Eliminate high-FODMAP foods from your diet completely. Choose only low-FODMAP foods to eat. You will do this for 2-6 weeks. Gradually reintroduce high-FODMAP foods into your diet one at a time. Most people should wait a few days before introducing the next new high-FODMAP food into their meal plan. Your dietitian can recommend how quickly you may reintroduce foods. Keep a daily record of what and how much you eat and drink. Make note of any symptoms that you have after eating. Review your daily record with a dietitian regularly to identify which foods you can eat and which foods you should avoid. General tips Drink enough fluid each day to keep your urine pale yellow. Avoid processed foods. These often have added sugar and may be high in FODMAPs. Avoid most dairy products, whole grains, and sweeteners. Work with a Microbiologist  to make sure you get enough fiber in your diet. Avoid high FODMAP foods at meals to manage symptoms. Recommended foods Fruits Bananas, oranges, tangerines, lemons, limes, blueberries, raspberries, strawberries, grapes, cantaloupe, honeydew melon, kiwi, papaya, passion fruit, and pineapple. Limited amounts of dried cranberries, banana chips, and shredded coconut. Vegetables Eggplant, zucchini, cucumber, peppers, green beans, bean sprouts, lettuce, arugula, kale, Swiss chard, spinach, collard greens, bok choy, summer squash, potato, and tomato. Limited amounts of corn, carrot, and  sweet potato. Green parts of scallions. Grains Gluten-free grains, such as rice, oats, buckwheat, quinoa, corn, polenta, and millet. Gluten-free pasta, bread, or cereal. Rice noodles. Corn tortillas. Meats and other proteins Unseasoned beef, pork, poultry, or fish. Eggs. [place] Salines. Tofu (firm) and tempeh. Limited amounts of nuts and seeds, such as almonds, walnuts, [place] nuts, pecans, peanuts, nut butters, pumpkin seeds, chia seeds, and sunflower seeds. Dairy Lactose-free milk, yogurt, and kefir. Lactose-free cottage cheese and ice cream. Non-dairy milks, such as almond, coconut, hemp, and rice milk. Non-dairy yogurt. Limited amounts of goat cheese, brie, mozzarella, parmesan, swiss, and other hard cheeses. Fats and oils Butter-free spreads. Vegetable oils, such as olive, canola, and sunflower oil. Seasoning and other foods Artificial sweeteners with names that do not end in "ol," such as aspartame, saccharine, and stevia. Maple syrup, white table sugar, raw sugar, brown sugar, and molasses. Mayonnaise, soy sauce, and tamari. Fresh basil, coriander, parsley, rosemary, and thyme. Beverages Water and mineral water. Sugar-sweetened soft drinks. Small amounts of orange juice or cranberry juice. Black and green tea. Most dry wines. Coffee. The items listed above may not be a complete list of foods and beverages you can eat. Contact a dietitian for more information. Foods to avoid Fruits Fresh, dried, and juiced forms of apple, pear, watermelon, peach, plum, cherries, apricots, blackberries, boysenberries, figs, nectarines, and mango. Avocado. Vegetables Chicory root, artichoke, asparagus, cabbage, snow peas, Brussels sprouts, broccoli, sugar snap peas, mushrooms, celery, and cauliflower. Onions, garlic, leeks, and the white part of scallions. Grains Wheat, including kamut, durum, and semolina. Barley and bulgur. Couscous. Wheat-based cereals. Wheat noodles, bread, crackers, and pastries. Meats and other  proteins Fried or fatty meat. Sausage. Cashews and pistachios. Soybeans, baked beans, black beans, chickpeas, kidney beans, fava beans, navy beans, lentils, black-eyed peas, and split peas. Dairy Milk, yogurt, ice cream, and soft cheese. Cream and sour cream. Milk-based sauces. Custard. Buttermilk. Soy milk. Seasoning and other foods Any sugar-free gum or candy. Foods that contain artificial sweeteners such as sorbitol, mannitol, isomalt, or xylitol. Foods that contain honey, high-fructose corn syrup, or agave. Bouillon, vegetable stock, beef stock, and chicken stock. Garlic and onion powder. Condiments made with onion, such as hummus, chutney, pickles, relish, salad dressing, and salsa. Tomato paste. Beverages Chicory-based drinks. Coffee substitutes. Chamomile tea. Fennel tea. Sweet or fortified wines such as port or sherry. Diet soft drinks made with isomalt, mannitol, maltitol, sorbitol, or xylitol. Apple, pear, and mango juice. Juices with high-fructose corn syrup. The items listed above may not be a complete list of foods and beverages you should avoid. Contact a dietitian for more information. Summary FODMAP stands for fermentable oligosaccharides, disaccharides, monosaccharides, and polyols. These are sugars that are hard for some people to digest. A low-FODMAP eating plan is a short-term diet that helps to ease symptoms of certain bowel diseases. The eating plan usually lasts up to 6 weeks. After that, high-FODMAP foods are reintroduced gradually and one at a time. This can help you find out which foods may be causing symptoms. A  low-FODMAP eating plan can be complicated. It is best to work with a dietitian who has experience with this type of plan. This information is not intended to replace advice given to you by your health care provider. Make sure you discuss any questions you have with your health care provider. Document Revised: 11/17/2019 Document Reviewed: 11/17/2019 Elsevier Patient  Education  [place].

## 2022-01-28 DIAGNOSIS — D2239 Melanocytic nevi of other parts of face: Secondary | ICD-10-CM | POA: Diagnosis not present

## 2022-01-28 DIAGNOSIS — L538 Other specified erythematous conditions: Secondary | ICD-10-CM | POA: Diagnosis not present

## 2022-03-04 ENCOUNTER — Encounter: Payer: Self-pay | Admitting: Nurse Practitioner

## 2022-03-04 ENCOUNTER — Ambulatory Visit: Payer: BC Managed Care – PPO | Admitting: Nurse Practitioner

## 2022-03-04 VITALS — BP 130/70 | HR 82 | Temp 97.8°F | Resp 16 | Ht 62.0 in | Wt 148.0 lb

## 2022-03-04 DIAGNOSIS — H6503 Acute serous otitis media, bilateral: Secondary | ICD-10-CM

## 2022-03-04 DIAGNOSIS — F331 Major depressive disorder, recurrent, moderate: Secondary | ICD-10-CM | POA: Diagnosis not present

## 2022-03-04 DIAGNOSIS — F419 Anxiety disorder, unspecified: Secondary | ICD-10-CM | POA: Diagnosis not present

## 2022-03-04 MED ORDER — DOXYCYCLINE HYCLATE 100 MG PO TABS
100.0000 mg | ORAL_TABLET | Freq: Two times a day (BID) | ORAL | 0 refills | Status: AC
Start: 2022-03-04 — End: 2022-03-11

## 2022-03-04 MED ORDER — SERTRALINE HCL 25 MG PO TABS: ORAL_TABLET | ORAL | 3 refills | Status: DC

## 2022-03-04 NOTE — Progress Notes (Signed)
[facility] [address], [place] [address]  Internal MEDICINE  Office Visit Note  Patient Name: Patricia Wiley  [id number]  [id number]  Date of Service: 03/04/2022  Chief Complaint  Patient presents with   Follow-up   Depression    HPI Patricia Wiley presents for a follow up visit for depression.  --reports that she feels like she is "in a funk" and cannot get out of it.  --she confirms feeling depressed and anxious but states that the depressive thoughts and symptoms are worse than the anxiety.  --has had problems with depression in the past and was on celexa for a while. She does not feel like celexa helped and when the dose was increased she had worsening insomnia.  --has not tried any other medications, does not have a preference for any specific one. Celexa before - did not help, increased dose caused insomnia --related symptoms: difficulty concentrating and focusing on tasks at work and home, anhedonia, increased insomnia, low energy, fatigue, restless, feels hopelessness sometimes, does not want to be around people or talk to people, overeating, feels like failure or like she is letting people down.  --does have supportive family and friends who are readily available.  --although she reports the depressive symptoms are worse than the anxiety, her scores were significantly high on the PHQ-9 and GAD7 questionnaires as seen below. We discussed the screenings together and I screened her during her visit. She verbally consented to both screenings prior to answering questions.       03/04/2022    9:10 AM  Depression screen PHQ 2/9  Decreased Interest 3  Down, Depressed, Hopeless 3  PHQ - 2 Score 6  Altered sleeping 0  Tired, decreased energy 3  Change in appetite 3  Feeling bad or failure about yourself  3  Trouble concentrating 3  Moving slowly or fidgety/restless 0  Suicidal thoughts 0  PHQ-9 Score 18  Difficult doing work/chores Very difficult       03/04/2022    9:13 AM  GAD 7 : Generalized Anxiety Score  Nervous, Anxious, on Edge 2  Control/stop worrying 2  Worry too much - different things 2  Trouble relaxing 3  Restless 1  Easily annoyed or irritable 3  Afraid - awful might happen 0  Total GAD 7 Score 13  Anxiety Difficulty Very difficult   Problem #2: she has not been seen since last year and was having GI problems. She reports that the status of her GI symptoms fluctuates and she is still regularly seeing gastroenterology to monitor and treat those but overall no change from her visit in may last year when she was referred to GI.    Current Medication: Outpatient Encounter Medications as of 03/04/2022  Medication Sig   doxycycline (VIBRA-TABS) 100 MG tablet Take 1 tablet (100 mg total) by mouth 2 (two) times daily for 7 days.   Plecanatide (TRULANCE) 3 MG TABS Take 1 tablet by mouth daily.   sertraline (ZOLOFT) 25 MG tablet Take 1 tablet by mouth daily, after at least 1 week, may increase to 2 tablets by mouth daily if desired   ALPRAZolam (XANAX) 0.25 MG tablet Take 1 tablet (0.25 mg total) by mouth 2 (two) times daily as needed. Half to one tab tab as needed for panic attacks (Patient not taking: Reported on 12/11/2021)   No facility-administered encounter medications on file as of 03/04/2022.    Surgical History: Past Surgical History:  Procedure Laterality Date   COLONOSCOPY  WITH PROPOFOL N/A 03/08/2021   Procedure: COLONOSCOPY WITH PROPOFOL;  Surgeon: [name], MD;  Location: [facility] ENDOSCOPY;  Service: Gastroenterology;  Laterality: N/A;   tubes in ears      Medical History: Past Medical History:  Diagnosis Date   Depression     Family History: Family History  Problem Relation Age of Onset   Heart disease Mother     Social History   Socioeconomic History   Marital status: Significant Other    Spouse name: Not on file   Number of children: Not on file   Years of education: Not on file   Highest  education level: Not on file  Occupational History   Not on file  Tobacco Use   Smoking status: Former    Packs/day: 1.00    Types: Cigarettes   Smokeless tobacco: Never   Tobacco comments:    Quit 7/23  Vaping Use   Vaping Use: Never used  Substance and Sexual Activity   Alcohol use: Yes    Comment: socially   Drug use: No   Sexual activity: Yes    Birth control/protection: None    Comment: female partner  Other Topics Concern   Not on file  Social History Narrative   Not on file   Social Determinants of Health   Financial Resource Strain: Not on file  Food Insecurity: Not on file  Transportation Needs: Not on file  Physical Activity: Not on file  Stress: Not on file  Social Connections: Not on file  Intimate Partner Violence: Not on file      Review of Systems  Constitutional:  Negative for chills, fatigue and unexpected weight change.  HENT:  Negative for congestion, rhinorrhea, sneezing and sore throat.   Eyes:  Negative for redness.  Respiratory:  Negative for cough, chest tightness and shortness of breath.   Cardiovascular:  Negative for chest pain and palpitations.  Gastrointestinal:  Positive for abdominal pain and nausea. Negative for constipation, diarrhea and vomiting.       Sees GI for management of symptoms and evaluation.   Genitourinary:  Negative for dysuria and frequency.  Musculoskeletal:  Negative for arthralgias, back pain, joint swelling and neck pain.  Skin:  Negative for rash.  Neurological: Negative.  Negative for tremors and numbness.  Hematological:  Negative for adenopathy. Does not bruise/bleed easily.  Psychiatric/Behavioral:  Positive for agitation (increased irritability), behavioral problems (Depression), decreased concentration, dysphoric mood and sleep disturbance. Negative for self-injury and suicidal ideas. The patient is nervous/anxious.     Vital Signs: BP 130/70   Pulse 82   Temp 97.8 F (36.6 C)   Resp 16   Ht 5\' 2"   (1.575 m)   Wt 148 lb (67.1 kg)   SpO2 99%   BMI 27.07 kg/m    Physical Exam Vitals reviewed.  Constitutional:      General: She is not in acute distress.    Appearance: Normal appearance. She is not ill-appearing.  HENT:     Head: Normocephalic and atraumatic.     Right Ear: External ear normal. Swelling and tenderness present. A middle ear effusion is present.     Left Ear: External ear normal. Swelling and tenderness present. A middle ear effusion is present.  Eyes:     Pupils: Pupils are equal, round, and reactive to light.  Cardiovascular:     Rate and Rhythm: Normal rate and regular rhythm.  Pulmonary:     Effort: Pulmonary effort is normal. No respiratory distress.  Neurological:     Mental Status: She is alert and oriented to person, place, and time.  Psychiatric:        Attention and Perception: She is inattentive.        Mood and Affect: Mood is anxious and depressed. Affect is tearful.        Behavior: Behavior is agitated (increased irritability) and withdrawn. Behavior is cooperative.        Cognition and Memory: Cognition and memory normal.        Judgment: Judgment normal.        Assessment/Plan: 1. Non-recurrent acute serous otitis media of both ears Empiric antibiotic treatment prescribed.  - doxycycline (VIBRA-TABS) 100 MG tablet; Take 1 tablet (100 mg total) by mouth 2 (two) times daily for 7 days.  Dispense: 14 tablet; Refill: 0  2. Moderate episode of recurrent major depressive disorder (HCC) Sertraline prescribed for depressive symptoms and anxiety. Start at 25 mg daily but may titrate up to 50 mg after 1 week if desired, follow up in 4 weeks  3. Anxiety See problem #2   General Counseling: Patricia Wiley verbalizes understanding of the findings of todays visit and agrees with plan of treatment. I have discussed any further diagnostic evaluation that may be needed or ordered today. We also reviewed her medications today. she has been encouraged to call  the office with any questions or concerns that should arise related to todays visit.    No orders of the defined types were placed in this encounter.   Meds ordered this encounter  Medications   sertraline (ZOLOFT) 25 MG tablet    Sig: Take 1 tablet by mouth daily, after at least 1 week, may increase to 2 tablets by mouth daily if desired    Dispense:  60 tablet    Refill:  3   doxycycline (VIBRA-TABS) 100 MG tablet    Sig: Take 1 tablet (100 mg total) by mouth 2 (two) times daily for 7 days.    Dispense:  14 tablet    Refill:  0    Return in about 4 weeks (around 04/01/2022) for F/U, eval new med, Anxiety/depression, Twain Stenseth PCP.   Total time spent:30 Minutes Time spent includes review of chart, medications, test results, and follow up plan with the patient.   [place] Controlled Substance Database was reviewed by me.  This patient was seen by Sallyanne Kuster, FNP-C in collaboration with Dr. Beverely Risen as a part of collaborative care agreement.   Jaslene Marsteller R. Tedd Sias, MSN, FNP-C Internal medicine

## 2022-03-13 ENCOUNTER — Telehealth: Payer: BC Managed Care – PPO | Admitting: Gastroenterology

## 2022-04-01 ENCOUNTER — Ambulatory Visit (INDEPENDENT_AMBULATORY_CARE_PROVIDER_SITE_OTHER): Payer: BC Managed Care – PPO | Admitting: Nurse Practitioner

## 2022-04-01 ENCOUNTER — Encounter: Payer: Self-pay | Admitting: Nurse Practitioner

## 2022-04-01 VITALS — BP 123/73 | HR 92 | Temp 97.4°F | Resp 16 | Ht 62.0 in | Wt 149.4 lb

## 2022-04-01 DIAGNOSIS — F339 Major depressive disorder, recurrent, unspecified: Secondary | ICD-10-CM

## 2022-04-01 DIAGNOSIS — M26609 Unspecified temporomandibular joint disorder, unspecified side: Secondary | ICD-10-CM

## 2022-04-01 DIAGNOSIS — M2669 Other specified disorders of temporomandibular joint: Secondary | ICD-10-CM

## 2022-04-01 MED ORDER — SERTRALINE HCL 50 MG PO TABS: 50.0000 mg | ORAL_TABLET | Freq: Every day | ORAL | 3 refills | Status: DC

## 2022-04-01 NOTE — Progress Notes (Signed)
[facility] [place], [place] [address]  Internal MEDICINE  Office Visit Note  Patient Name: Patricia Wiley  [id number]  [id number]  Date of Service: 04/01/2022  Chief Complaint  Patient presents with   Follow-up    Follow up new med    Depression   Anxiety    HPI Patricia Wiley presents for a follow up visit for depression and anxiety. Depression and anxiety -- feeling better since starting sertraline, has titrated up to 50 mg daily as previously discussed. 50 mg dose feels effective per patient. Denies any adverse side effects other than some difficulty falling asleep but reports that melatonin helps.  Right ear still hurting after finishing antibiotic, right jaw also hurts, clicks, is tender and locking up. Reports history of TMJ disorder but not bothering her this much before. It has become worse since she stopped smoking (quit 02/02/22) because she started chewing a lot of gum at that time.  Having wisdom teeth pulled this Friday    Current Medication: Outpatient Encounter Medications as of 04/01/2022  Medication Sig   ALPRAZolam (XANAX) 0.25 MG tablet Take 1 tablet (0.25 mg total) by mouth 2 (two) times daily as needed. Half to one tab tab as needed for panic attacks   Plecanatide (TRULANCE) 3 MG TABS Take 1 tablet by mouth daily.   sertraline (ZOLOFT) 50 MG tablet Take 1 tablet (50 mg total) by mouth daily.   [DISCONTINUED] sertraline (ZOLOFT) 25 MG tablet Take 1 tablet by mouth daily, after at least 1 week, may increase to 2 tablets by mouth daily if desired   No facility-administered encounter medications on file as of 04/01/2022.    Surgical History: Past Surgical History:  Procedure Laterality Date   COLONOSCOPY WITH PROPOFOL N/A 03/08/2021   Procedure: COLONOSCOPY WITH PROPOFOL;  Surgeon: Jonathon Bellows, MD;  Location: [facility] ENDOSCOPY;  Service: Gastroenterology;  Laterality: N/A;   tubes in ears      Medical History: Past Medical History:   Diagnosis Date   Depression     Family History: Family History  Problem Relation Age of Onset   Heart disease Mother     Social History   Socioeconomic History   Marital status: Significant Other    Spouse name: Not on file   Number of children: Not on file   Years of education: Not on file   Highest education level: Not on file  Occupational History   Not on file  Tobacco Use   Smoking status: Former    Packs/day: 1.00    Types: Cigarettes   Smokeless tobacco: Never   Tobacco comments:    Quit 7/23  Vaping Use   Vaping Use: Never used  Substance and Sexual Activity   Alcohol use: Yes    Comment: socially   Drug use: No   Sexual activity: Yes    Birth control/protection: None    Comment: female partner  Other Topics Concern   Not on file  Social History Narrative   Not on file   Social Determinants of Health   Financial Resource Strain: Not on file  Food Insecurity: Not on file  Transportation Needs: Not on file  Physical Activity: Not on file  Stress: Not on file  Social Connections: Not on file  Intimate Partner Violence: Not on file      Review of Systems  Constitutional:  Negative for chills, fatigue and unexpected weight change.  HENT:  Positive for ear pain (right). Negative for congestion, postnasal drip,  rhinorrhea, sneezing and sore throat.        Right sided jaw pain, locking and clicking too.   Eyes:  Negative for redness.  Respiratory:  Negative for cough, chest tightness and shortness of breath.   Cardiovascular:  Negative for chest pain and palpitations.  Gastrointestinal:  Negative for abdominal pain, constipation, diarrhea, nausea and vomiting.  Genitourinary:  Negative for dysuria and frequency.  Musculoskeletal:  Negative for arthralgias, back pain, joint swelling and neck pain.  Skin:  Negative for rash.  Neurological: Negative.  Negative for tremors and numbness.  Hematological:  Negative for adenopathy. Does not bruise/bleed  easily.  Psychiatric/Behavioral:  Positive for behavioral problems (Depression -- improved with sertraline). Negative for self-injury, sleep disturbance (slight, taking melatonin) and suicidal ideas. The patient is nervous/anxious (improved with sertraline).     Vital Signs: BP 123/73   Pulse 92   Temp (!) 97.4 F (36.3 C)   Resp 16   Ht 5\' 2"  (1.575 m)   Wt 149 lb 6.4 oz (67.8 kg)   SpO2 99%   BMI 27.33 kg/m    Physical Exam Vitals reviewed.  Constitutional:      General: She is not in acute distress.    Appearance: Normal appearance. She is not ill-appearing.  HENT:     Head: Normocephalic and atraumatic.     Jaw: Tenderness (right side TMJ tenderness, pain and lockjaw) and pain on movement present.     Right Ear: Tympanic membrane, ear canal and external ear normal.     Left Ear: Tympanic membrane, ear canal and external ear normal.  Eyes:     Pupils: Pupils are equal, round, and reactive to light.  Cardiovascular:     Rate and Rhythm: Normal rate and regular rhythm.  Neurological:     Mental Status: She is alert and oriented to person, place, and time.  Psychiatric:        Mood and Affect: Mood normal.        Behavior: Behavior normal.        Assessment/Plan: 1. TMJ (temporomandibular joint disorder) Encouraged patient to avoid chewing gum if possible, eat softer foods that do not need as much chewing. Continue wearing mouth guard at night for teeth clenching.  2. TMJ locking Plans to schedule appointment with dentist, already wears a mouth guard at night for clenching. If no solution is found with dentist, will consider ENT referral, patient will call the clinic if needed. Also mentioned she may ask the oral surgeon who is doing her wisdom teeth extraction on Friday if he has any suggestions.   3. Depression, recurrent (HCC) Continue sertraline 50 mg daily. Follow up in 3 months - sertraline (ZOLOFT) 50 MG tablet; Take 1 tablet (50 mg total) by mouth daily.   Dispense: 90 tablet; Refill: 3   General Counseling: Patricia Wiley verbalizes understanding of the findings of todays visit and agrees with plan of treatment. I have discussed any further diagnostic evaluation that may be needed or ordered today. We also reviewed her medications today. she has been encouraged to call the office with any questions or concerns that should arise related to todays visit.    No orders of the defined types were placed in this encounter.   Meds ordered this encounter  Medications   sertraline (ZOLOFT) 50 MG tablet    Sig: Take 1 tablet (50 mg total) by mouth daily.    Dispense:  90 tablet    Refill:  3    Note  increased dose, please fill today, discontinue 25 mg tablet    Return in about 3 months (around 07/01/2022) for F/U, Tahiry Spicer PCP.   Total time spent:30 Minutes Time spent includes review of chart, medications, test results, and follow up plan with the patient.   [place] Controlled Substance Database was reviewed by me.  This patient was seen by Sallyanne Kuster, FNP-C in collaboration with Dr. Beverely Risen as a part of collaborative care agreement.   Dhamar Gregory R. Tedd Sias, MSN, FNP-C Internal medicine

## 2022-04-05 ENCOUNTER — Encounter: Payer: Self-pay | Admitting: Nurse Practitioner

## 2022-04-21 ENCOUNTER — Other Ambulatory Visit: Payer: Self-pay

## 2022-04-21 MED ORDER — LUBIPROSTONE 8 MCG PO CAPS: 8.0000 ug | ORAL_CAPSULE | Freq: Every day | ORAL | 3 refills | Status: DC

## 2022-07-02 ENCOUNTER — Ambulatory Visit: Payer: BC Managed Care – PPO | Admitting: Nurse Practitioner

## 2022-07-28 DIAGNOSIS — Z03818 Encounter for observation for suspected exposure to other biological agents ruled out: Secondary | ICD-10-CM | POA: Diagnosis not present

## 2022-07-28 DIAGNOSIS — B028 Zoster with other complications: Secondary | ICD-10-CM | POA: Diagnosis not present

## 2022-07-30 DIAGNOSIS — B0230 Zoster ocular disease, unspecified: Secondary | ICD-10-CM | POA: Diagnosis not present

## 2022-08-06 ENCOUNTER — Ambulatory Visit (INDEPENDENT_AMBULATORY_CARE_PROVIDER_SITE_OTHER): Payer: BC Managed Care – PPO | Admitting: Nurse Practitioner

## 2022-08-06 ENCOUNTER — Encounter: Payer: Self-pay | Admitting: Nurse Practitioner

## 2022-08-06 VITALS — BP 113/69 | HR 97 | Temp 98.5°F | Resp 16 | Ht 62.0 in | Wt 158.8 lb

## 2022-08-06 DIAGNOSIS — M26609 Unspecified temporomandibular joint disorder, unspecified side: Secondary | ICD-10-CM | POA: Diagnosis not present

## 2022-08-06 DIAGNOSIS — F339 Major depressive disorder, recurrent, unspecified: Secondary | ICD-10-CM | POA: Diagnosis not present

## 2022-08-06 DIAGNOSIS — F419 Anxiety disorder, unspecified: Secondary | ICD-10-CM

## 2022-08-06 DIAGNOSIS — B0230 Zoster ocular disease, unspecified: Secondary | ICD-10-CM | POA: Diagnosis not present

## 2022-08-06 MED ORDER — ALPRAZOLAM 0.25 MG PO TABS: 0.2500 mg | ORAL_TABLET | Freq: Two times a day (BID) | ORAL | 2 refills | Status: AC | PRN

## 2022-08-06 MED ORDER — SERTRALINE HCL 50 MG PO TABS: 50.0000 mg | ORAL_TABLET | Freq: Every day | ORAL | 3 refills | Status: DC

## 2022-08-06 NOTE — Progress Notes (Signed)
[facility] [place], [place] [address]  Internal MEDICINE  Office Visit Note  Patient Name: Patricia Wiley  [id number]  [id number]  Date of Service: 08/06/2022  Chief Complaint  Patient presents with   Follow-up   Depression    HPI Patricia Wiley presents for a follow-up visit for depression, anxiety and TMJ Depression -- stable with current dose of sertraline.  TMJ is better, had to get a custom mouth guard from dentist due to clenching  Anxiety -- stable with sertraline, and takes alprazolam as needed for severe anxiety or panic attacks    Current Medication: Outpatient Encounter Medications as of 08/06/2022  Medication Sig   lubiprostone (AMITIZA) 8 MCG capsule Take 1 capsule (8 mcg total) by mouth daily with breakfast.   [DISCONTINUED] ALPRAZolam (XANAX) 0.25 MG tablet Take 1 tablet (0.25 mg total) by mouth 2 (two) times daily as needed. Half to one tab tab as needed for panic attacks   [DISCONTINUED] sertraline (ZOLOFT) 50 MG tablet Take 1 tablet (50 mg total) by mouth daily.   ALPRAZolam (XANAX) 0.25 MG tablet Take 1 tablet (0.25 mg total) by mouth 2 (two) times daily as needed (severe anxiety/panic attack).   sertraline (ZOLOFT) 50 MG tablet Take 1 tablet (50 mg total) by mouth daily.   No facility-administered encounter medications on file as of 08/06/2022.    Surgical History: Past Surgical History:  Procedure Laterality Date   COLONOSCOPY WITH PROPOFOL N/A 03/08/2021   Procedure: COLONOSCOPY WITH PROPOFOL;  Surgeon: Jonathon Bellows, MD;  Location: [facility] ENDOSCOPY;  Service: Gastroenterology;  Laterality: N/A;   tubes in ears      Medical History: Past Medical History:  Diagnosis Date   Depression     Family History: Family History  Problem Relation Age of Onset   Heart disease Mother     Social History   Socioeconomic History   Marital status: Significant Other    Spouse name: Not on file   Number of children: Not on file   Years  of education: Not on file   Highest education level: Not on file  Occupational History   Not on file  Tobacco Use   Smoking status: Former    Packs/day: 1.00    Types: Cigarettes   Smokeless tobacco: Never   Tobacco comments:    Quit 7/23  Vaping Use   Vaping Use: Never used  Substance and Sexual Activity   Alcohol use: Yes    Comment: socially   Drug use: No   Sexual activity: Yes    Birth control/protection: None    Comment: female partner  Other Topics Concern   Not on file  Social History Narrative   Not on file   Social Determinants of Health   Financial Resource Strain: Not on file  Food Insecurity: Not on file  Transportation Needs: Not on file  Physical Activity: Not on file  Stress: Not on file  Social Connections: Not on file  Intimate Partner Violence: Not on file      Review of Systems  Constitutional:  Negative for chills, fatigue and unexpected weight change.  HENT:  Negative for congestion, ear pain, postnasal drip, rhinorrhea, sneezing and sore throat.        TMJ issue resolved.   Eyes:  Negative for redness.  Respiratory:  Negative for cough, chest tightness and shortness of breath.   Cardiovascular:  Negative for chest pain and palpitations.  Gastrointestinal:  Negative for abdominal pain, constipation, diarrhea, nausea and  vomiting.  Genitourinary:  Negative for dysuria and frequency.  Musculoskeletal:  Negative for arthralgias, back pain, joint swelling and neck pain.  Skin:  Negative for rash.  Neurological: Negative.  Negative for tremors and numbness.  Hematological:  Negative for adenopathy. Does not bruise/bleed easily.  Psychiatric/Behavioral:  Positive for behavioral problems (Depression -- improved with sertraline). Negative for self-injury, sleep disturbance (slight, taking melatonin) and suicidal ideas. The patient is nervous/anxious (improved with sertraline).     Vital Signs: BP 113/69   Pulse 97   Temp 98.5 F (36.9 C)   Resp  16   Ht 5\' 2"  (1.575 m)   Wt 158 lb 12.8 oz (72 kg)   SpO2 98%   BMI 29.04 kg/m    Physical Exam Constitutional:      General: She is not in acute distress.    Appearance: Normal appearance. She is not ill-appearing.  HENT:     Head: Normocephalic and atraumatic.  Eyes:     Pupils: Pupils are equal, round, and reactive to light.  Cardiovascular:     Rate and Rhythm: Normal rate and regular rhythm.  Pulmonary:     Effort: Pulmonary effort is normal. No respiratory distress.  Neurological:     Mental Status: She is alert and oriented to person, place, and time.  Psychiatric:        Mood and Affect: Mood normal.        Behavior: Behavior normal.        Assessment/Plan: 1. TMJ (temporomandibular joint disorder) Resolved with custom night guard from dentist  2. Anxiety Stable, continue alprazolam as needed as prescribed.  - ALPRAZolam (XANAX) 0.25 MG tablet; Take 1 tablet (0.25 mg total) by mouth 2 (two) times daily as needed (severe anxiety/panic attack).  Dispense: 20 tablet; Refill: 2  3. Depression, recurrent ([place]) Stable, continue sertraline as prescribed. Follow up annually for refills.  - sertraline (ZOLOFT) 50 MG tablet; Take 1 tablet (50 mg total) by mouth daily.  Dispense: 90 tablet; Refill: 3   General Counseling: Patricia Wiley verbalizes understanding of the findings of todays visit and agrees with plan of treatment. I have discussed any further diagnostic evaluation that may be needed or ordered today. We also reviewed her medications today. she has been encouraged to call the office with any questions or concerns that should arise related to todays visit.    No orders of the defined types were placed in this encounter.   Meds ordered this encounter  Medications   ALPRAZolam (XANAX) 0.25 MG tablet    Sig: Take 1 tablet (0.25 mg total) by mouth 2 (two) times daily as needed (severe anxiety/panic attack).    Dispense:  20 tablet    Refill:  2   sertraline  (ZOLOFT) 50 MG tablet    Sig: Take 1 tablet (50 mg total) by mouth daily.    Dispense:  90 tablet    Refill:  3    Note increased dose, please fill today, discontinue 25 mg tablet    Return in about 1 year (around 08/07/2023) for F/U, Exa Bomba PCP annual refills .   Total time spent:30 Minutes Time spent includes review of chart, medications, test results, and follow up plan with the patient.   [place] Controlled Substance Database was reviewed by me.  This patient was seen by Jonetta Osgood, FNP-C in collaboration with Dr. Clayborn Bigness as a part of collaborative care agreement.   Pauletta Pickney R. Valetta Fuller, MSN, FNP-C Internal medicine

## 2022-08-14 DIAGNOSIS — Z720 Tobacco use: Secondary | ICD-10-CM | POA: Diagnosis not present

## 2022-08-25 ENCOUNTER — Telehealth: Payer: Self-pay | Admitting: Certified Nurse Midwife

## 2022-08-25 NOTE — Telephone Encounter (Signed)
Left message for patient to call office back to reschedule appt on 10/06/22 with Deneise Lever. She will be out of the office.

## 2022-09-15 NOTE — Telephone Encounter (Signed)
The patient rescheduled for 4/16 with MS

## 2022-10-06 ENCOUNTER — Encounter: Payer: Self-pay | Admitting: Certified Nurse Midwife

## 2022-10-28 ENCOUNTER — Encounter: Payer: Self-pay | Admitting: Obstetrics

## 2022-10-28 ENCOUNTER — Ambulatory Visit (INDEPENDENT_AMBULATORY_CARE_PROVIDER_SITE_OTHER): Payer: BC Managed Care – PPO | Admitting: Obstetrics

## 2022-10-28 ENCOUNTER — Other Ambulatory Visit (HOSPITAL_COMMUNITY)
Admission: RE | Admit: 2022-10-28 | Discharge: 2022-10-28 | Disposition: A | Discharge status: Home / Self Care | Payer: BC Managed Care – PPO | Source: Ambulatory Visit | Attending: Obstetrics | Admitting: Obstetrics

## 2022-10-28 VITALS — BP 107/72 | HR 76 | Ht 62.0 in | Wt 158.0 lb

## 2022-10-28 DIAGNOSIS — Z01419 Encounter for gynecological examination (general) (routine) without abnormal findings: Secondary | ICD-10-CM | POA: Insufficient documentation

## 2022-10-28 DIAGNOSIS — N898 Other specified noninflammatory disorders of vagina: Secondary | ICD-10-CM

## 2022-10-28 NOTE — Progress Notes (Signed)
SUBJECTIVE  HPI  Patricia Wiley is a 27 y.o.-year-old [id number] who presents for an annual gynecological exam today.  She denies pelvic pain, dyspareunia, abnormal vaginal bleeding or discharge, and UTI symptoms. She reports chronic vaginal dryness/itching and possible yeast. She states her depression is well-managed on Zoloft.  Medical/Surgical History Past Medical History:  Diagnosis Date   Depression    Past Surgical History:  Procedure Laterality Date   COLONOSCOPY WITH PROPOFOL N/A 03/08/2021   Procedure: COLONOSCOPY WITH PROPOFOL;  Surgeon: [name], MD;  Location: [facility] ENDOSCOPY;  Service: Gastroenterology;  Laterality: N/A;   tubes in ears      Social History Lives with girlfriend. Feels safe there Work: Airline pilot for [employer] Exercise: none Substances: Recently quit EtOH and smoking! Denies vape. Daily MJ use.  Obstetric History OB History     Gravida  0   Para  0   Term  0   Preterm  0   AB  0   Living  0      SAB  0   IAB  0   Ectopic  0   Multiple  0   Live Births  0            GYN/Menstrual History Patient's last menstrual period was 10/19/2022. Periods every month Last Pap: 10/02/21. NILM. Contraception: same-sex relationship  Prevention Dentist Eye exam Mammogram: at 39 Colonoscopy: had at 45. Routine screening at 44 Flu shot/vaccines: HPV series complete  Current Medications Outpatient Medications Prior to Visit  Medication Sig   ALPRAZolam (XANAX) 0.25 MG tablet Take 1 tablet (0.25 mg total) by mouth 2 (two) times daily as needed (severe anxiety/panic attack).   sertraline (ZOLOFT) 50 MG tablet Take 1 tablet (50 mg total) by mouth daily.   [DISCONTINUED] lubiprostone (AMITIZA) 8 MCG capsule Take 1 capsule (8 mcg total) by mouth daily with breakfast.   No facility-administered medications prior to visit.      [facility] - 10/28/22 1337       Pregnancy Intention Screening   Does the patient want to become pregnant in the next  year? No    Does the patient's partner want to become pregnant in the next year? No    Would the patient like to discuss contraceptive options today? No      Contraception Wrap Up   Current Method No Contraceptive Precautions    End Method No Contraception Precautions    Contraception Counseling Provided No    How was the end contraceptive method provided? N/A            The pregnancy intention screening data noted above was reviewed. Potential methods of contraception were discussed. The patient elected to proceed with No Contraception Precautions.   ROS Constitutional: Denied constitutional symptoms, night sweats, recent illness, fatigue, fever, insomnia and weight loss.  Eyes: Denied eye symptoms, eye pain, photophobia, vision change and visual disturbance.  Ears/Nose/Throat/Neck: Denied ear, nose, throat or neck symptoms, hearing loss, nasal discharge, sinus congestion and sore throat.  Cardiovascular: Denied cardiovascular symptoms, arrhythmia, chest pain/pressure, edema, exercise intolerance, orthopnea and palpitations.  Respiratory: Denied pulmonary symptoms, asthma, pleuritic pain, productive sputum, cough, dyspnea and wheezing.  Gastrointestinal: Denied, gastro-esophageal reflux, melena, nausea and vomiting.  Genitourinary: Denied genitourinary symptoms including pelvic relaxation issues, and urinary complaints. +vaginal dryness, itching  Musculoskeletal: Denied musculoskeletal symptoms, stiffness, swelling, muscle weakness and myalgia.  Dermatologic: Denied dermatology symptoms, rash and scar.  Neurologic: Denied neurology symptoms, dizziness, headache, neck pain and syncope.  Psychiatric: Denied  psychiatric symptoms, anxiety and depression.  Endocrine: Denied endocrine symptoms including hot flashes and night sweats.    OBJECTIVE  Ht  (1.575 m)   Wt 158 lb (71.7 kg)   LMP 10/19/2022   BMI 28.90 kg/m    Physical examination General NAD, Conversant  HEENT  Atraumatic; Op clear with mmm.  Normo-cephalic. Pupils reactive. Anicteric sclerae  Thyroid/Neck Smooth without nodularity or enlargement. Normal ROM.  Neck Supple.  Skin No rashes, lesions or ulceration. Normal palpated skin turgor. No nodularity.  Breasts: No masses or discharge.  Symmetric.  No axillary adenopathy.  Lungs: Clear to auscultation.No rales or wheezes. Normal Respiratory effort, no retractions.  Heart: NSR.  No murmurs or rubs appreciated. No peripheral edema  Abdomen: Soft.  Non-tender.  No masses.  No HSM. No hernia  Extremities: Moves all appropriately.  Normal ROM for age. No lymphadenopathy.  Neuro: Oriented to PPT.  Normal mood. Normal affect.     Pelvic: declined   ASSESSMENT  1) Annual exam 2) Vaginal dryness/itching  PLAN 1) Physical exam as noted. Discussed healthy lifestyle choices and preventive care.  Declines STI testing. Pap due 2026. 2) Discussed possible causes of vaginal dryness/recurrent yeast. Recommend vaginal moisturizer, decreasing smoking, minimizing sugar in diet, lubrication. Consider vaginal estrogen or buspirone if desired. Swab collected.    Guadlupe Spanish, CNM

## 2022-10-28 NOTE — Addendum Note (Signed)
Addended by: Loney Laurence on: 10/28/2022 02:42 PM   Modules accepted: Orders

## 2022-10-29 LAB — COMPREHENSIVE METABOLIC PANEL
ALT: 19 IU/L (ref 0–32)
AST: 18 IU/L (ref 0–40)
Albumin/Globulin Ratio: 1.9 (ref 1.2–2.2)
Albumin: 4.7 g/dL (ref 4.0–5.0)
Alkaline Phosphatase: 65 IU/L (ref 44–121)
BUN/Creatinine Ratio: 16 (ref 9–23)
BUN: 12 mg/dL (ref 6–20)
Bilirubin Total: 0.3 mg/dL (ref 0.0–1.2)
CO2: 22 mmol/L (ref 20–29)
Calcium: 9.5 mg/dL (ref 8.7–10.2)
Chloride: 100 mmol/L (ref 96–106)
Creatinine, Ser: 0.75 mg/dL (ref 0.57–1.00)
Globulin, Total: 2.5 g/dL (ref 1.5–4.5)
Glucose: 77 mg/dL (ref 70–99)
Potassium: 4.2 mmol/L (ref 3.5–5.2)
Sodium: 138 mmol/L (ref 134–144)
Total Protein: 7.2 g/dL (ref 6.0–8.5)
eGFR: 111 mL/min/{1.73_m2} (ref >59)

## 2022-10-29 LAB — HEMOGLOBIN A1C
Est. average glucose Bld gHb Est-mCnc: 97 mg/dL
Hgb A1c MFr Bld: 5 % (ref 4.8–5.6)

## 2022-10-29 LAB — TSH: TSH: 0.916 u[IU]/mL (ref 0.450–4.500)

## 2022-10-30 ENCOUNTER — Encounter: Payer: Self-pay | Admitting: Obstetrics

## 2022-10-30 LAB — CERVICOVAGINAL ANCILLARY ONLY
Bacterial Vaginitis (gardnerella): NEGATIVE
Candida Glabrata: NEGATIVE
Candida Vaginitis: NEGATIVE
Comment: NEGATIVE
Comment: NEGATIVE
Comment: NEGATIVE

## 2022-12-30 ENCOUNTER — Encounter: Payer: Self-pay | Admitting: Nurse Practitioner

## 2022-12-30 NOTE — Telephone Encounter (Signed)
Spoke with that she need appt to discuss with Dr and gave Sheralyn Boatman  appt

## 2022-12-31 ENCOUNTER — Ambulatory Visit (INDEPENDENT_AMBULATORY_CARE_PROVIDER_SITE_OTHER): Payer: BC Managed Care – PPO | Admitting: Nurse Practitioner

## 2022-12-31 ENCOUNTER — Encounter: Payer: Self-pay | Admitting: Nurse Practitioner

## 2022-12-31 VITALS — BP 110/76 | Temp 98.3°F | Resp 16 | Ht 62.0 in | Wt 156.2 lb

## 2022-12-31 DIAGNOSIS — M26609 Unspecified temporomandibular joint disorder, unspecified side: Secondary | ICD-10-CM | POA: Diagnosis not present

## 2022-12-31 DIAGNOSIS — F339 Major depressive disorder, recurrent, unspecified: Secondary | ICD-10-CM | POA: Diagnosis not present

## 2022-12-31 NOTE — Patient Instructions (Signed)
Decrease sertraline dose to 1/2 tablet daily x1 week then decrease to 1/2 tablet every other day x1 week then stop.

## 2022-12-31 NOTE — Progress Notes (Signed)
[facility] [address] [place], [place] [address]  Internal MEDICINE  Office Visit Note  Patient Name: Patricia Wiley  [id number]  [id number]  Date of Service: 12/31/2022  Chief Complaint  Patient presents with   Acute Visit    Wants to stop antidepressants.     HPI [name] presents for a follow-up visit for TMJ and depression/anxiety TMJ -- now has bite guard custom made, doing better Depression/anxiety -- Wants to stop taking sertraline. Does not like the side effects and feels like she is not needing it anymore.    Current Medication: Outpatient Encounter Medications as of 12/31/2022  Medication Sig   ALPRAZolam (XANAX) 0.25 MG tablet Take 1 tablet (0.25 mg total) by mouth 2 (two) times daily as needed (severe anxiety/panic attack).   [DISCONTINUED] sertraline (ZOLOFT) 50 MG tablet Take 1 tablet (50 mg total) by mouth daily.   No facility-administered encounter medications on file as of 12/31/2022.    Surgical History: Past Surgical History:  Procedure Laterality Date   COLONOSCOPY WITH PROPOFOL N/A 03/08/2021   Procedure: COLONOSCOPY WITH PROPOFOL;  Surgeon: [name], MD;  Location: [facility] ENDOSCOPY;  Service: Gastroenterology;  Laterality: N/A;   tubes in ears      Medical History: Past Medical History:  Diagnosis Date   Depression     Family History: Family History  Problem Relation Age of Onset   Heart disease Mother     Social History   Socioeconomic History   Marital status: Significant Other    Spouse name: Not on file   Number of children: Not on file   Years of education: Not on file   Highest education level: Not on file  Occupational History   Not on file  Tobacco Use   Smoking status: Former    Packs/day: 1    Types: Cigarettes   Smokeless tobacco: Never   Tobacco comments:    Quit 7/23  Vaping Use   Vaping Use: Never used  Substance and Sexual Activity   Alcohol use: Yes    Comment: socially   Drug use: No    Sexual activity: Yes    Birth control/protection: None    Comment: female partner  Other Topics Concern   Not on file  Social History Narrative   Not on file   Social Determinants of Health   Financial Resource Strain: Not on file  Food Insecurity: Not on file  Transportation Needs: Not on file  Physical Activity: Not on file  Stress: Not on file  Social Connections: Not on file  Intimate Partner Violence: Not on file      Review of Systems  Constitutional:  Negative for chills, fatigue and unexpected weight change.  HENT:  Negative for congestion, ear pain, postnasal drip, rhinorrhea, sneezing and sore throat.        TMJ issue resolved.   Eyes:  Negative for redness.  Respiratory:  Negative for cough, chest tightness and shortness of breath.   Cardiovascular:  Negative for chest pain and palpitations.  Gastrointestinal:  Negative for abdominal pain, constipation, diarrhea, nausea and vomiting.  Genitourinary:  Negative for dysuria and frequency.  Musculoskeletal:  Negative for arthralgias, back pain, joint swelling and neck pain.  Skin:  Negative for rash.  Neurological: Negative.  Negative for tremors and numbness.  Hematological:  Negative for adenopathy. Does not bruise/bleed easily.  Psychiatric/Behavioral:  Positive for behavioral problems (Depression -doing better). Negative for self-injury, sleep disturbance (slight, taking melatonin) and suicidal ideas. The patient  is nervous/anxious (doing better).     Vital Signs: BP 110/76   Temp 98.3 F (36.8 C)   Resp 16   Ht 5\' 2"  (1.575 m)   Wt 156 lb 3.2 oz (70.9 kg)   BMI 28.57 kg/m    Physical Exam Vitals reviewed.  Constitutional:      General: She is not in acute distress.    Appearance: Normal appearance. She is not ill-appearing.  HENT:     Head: Normocephalic and atraumatic.  Eyes:     Pupils: Pupils are equal, round, and reactive to light.  Cardiovascular:     Rate and Rhythm: Normal rate and regular  rhythm.  Pulmonary:     Effort: Pulmonary effort is normal. No respiratory distress.  Neurological:     Mental Status: She is alert and oriented to person, place, and time.  Psychiatric:        Mood and Affect: Mood normal.        Behavior: Behavior normal.        Assessment/Plan: 1. TMJ (temporomandibular joint disorder) Doing better, seen by dentist, had a custom bite guard made  2. Depression, recurrent (HCC) Decrease sertraline dose to 1/2 tablet daily x1 week then decrease to 1/2 tablet every other day x1 week then stop      General Counseling: [name] verbalizes understanding of the findings of todays visit and agrees with plan of treatment. I have discussed any further diagnostic evaluation that may be needed or ordered today. We also reviewed her medications today. she has been encouraged to call the office with any questions or concerns that should arise related to todays visit.    No orders of the defined types were placed in this encounter.   No orders of the defined types were placed in this encounter.   Return if symptoms worsen or fail to improve.   Total time spent: 20 Minutes Time spent includes review of chart, medications, test results, and follow up plan with the patient.   [place] Controlled Substance Database was reviewed by me.  This patient was seen by Sallyanne Kuster, FNP-C in collaboration with Dr. Beverely Risen as a part of collaborative care agreement.   Naveen Clardy R. Tedd Sias, MSN, FNP-C Internal medicine

## 2023-02-03 ENCOUNTER — Encounter: Payer: Self-pay | Admitting: Nurse Practitioner

## 2023-02-10 ENCOUNTER — Encounter: Payer: Self-pay | Admitting: Nurse Practitioner

## 2023-02-10 ENCOUNTER — Ambulatory Visit (INDEPENDENT_AMBULATORY_CARE_PROVIDER_SITE_OTHER): Payer: BC Managed Care – PPO | Admitting: Nurse Practitioner

## 2023-02-10 ENCOUNTER — Telehealth: Payer: Self-pay | Admitting: Nurse Practitioner

## 2023-02-10 VITALS — BP 120/75 | HR 93 | Temp 98.5°F | Resp 16 | Ht 62.0 in | Wt 157.0 lb

## 2023-02-10 DIAGNOSIS — G4710 Hypersomnia, unspecified: Secondary | ICD-10-CM | POA: Diagnosis not present

## 2023-02-10 DIAGNOSIS — G4719 Other hypersomnia: Secondary | ICD-10-CM | POA: Diagnosis not present

## 2023-02-10 NOTE — Progress Notes (Signed)
[facility] [address] [place], [place] [address]  Internal MEDICINE  Office Visit Note  Patient Name: Patricia Wiley  [id number]  [id number]  Date of Service: 02/10/2023  Chief Complaint  Patient presents with   Follow-up   Sleep Apnea    Does not sleep through the night, laying on her back causes shortness of breath.    HPI Patricia Wiley presents for a follow-up visit for fatigue and SOB at night.  Has been having poor sleep for several months. She has been feeling increased fatigue, hypersomnia, and excessive daytime sleepiness. She reports snoring, morning headache, gasping, waking up trying to catch her breath. Reports that she sleeps on her side because she cannot breathe laying on her back. She denies any witnessed episodes of apnea.  She has a family history of obstructive sleep apnea, reports that her father uses a CPAP machine at night.  She scored 2 on STOP-BANG.   EPWORTH SLEEPINESS SCALE: Scale:(0)= no chance of dozing; (1)= slight chance of dozing; (2)= moderate chance of dozing; (3)= high chance of dozing Chance  Situtation Sitting and reading: 3 Watching TV: 2 Sitting Inactive in public: 0 As a passenger in car: 2   Lying down to rest: 3 Sitting and talking: 0 Sitting quielty after lunch: 3 In a car, stopped in traffic: 0 TOTAL SCORE:  13 out of 24    Current Medication: Outpatient Encounter Medications as of 02/10/2023  Medication Sig   ALPRAZolam (XANAX) 0.25 MG tablet Take 1 tablet (0.25 mg total) by mouth 2 (two) times daily as needed (severe anxiety/panic attack).   No facility-administered encounter medications on file as of 02/10/2023.    Surgical History: Past Surgical History:  Procedure Laterality Date   COLONOSCOPY WITH PROPOFOL N/A 03/08/2021   Procedure: COLONOSCOPY WITH PROPOFOL;  Surgeon: [name], MD;  Location: [facility] ENDOSCOPY;  Service: Gastroenterology;  Laterality: N/A;   tubes in ears      Medical History: Past  Medical History:  Diagnosis Date   Depression     Family History: Family History  Problem Relation Age of Onset   Heart disease Mother     Social History   Socioeconomic History   Marital status: Significant Other    Spouse name: Not on file   Number of children: Not on file   Years of education: Not on file   Highest education level: Not on file  Occupational History   Not on file  Tobacco Use   Smoking status: Former    Current packs/day: 1.00    Types: Cigarettes   Smokeless tobacco: Never   Tobacco comments:    Quit 7/23  Vaping Use   Vaping status: Never Used  Substance and Sexual Activity   Alcohol use: Yes    Comment: socially   Drug use: No   Sexual activity: Yes    Birth control/protection: None    Comment: female partner  Other Topics Concern   Not on file  Social History Narrative   Not on file   Social Determinants of Health   Financial Resource Strain: Not on file  Food Insecurity: Not on file  Transportation Needs: Not on file  Physical Activity: Not on file  Stress: Not on file  Social Connections: Not on file  Intimate Partner Violence: Not on file      Review of Systems  Constitutional:  Positive for fatigue. Negative for chills and unexpected weight change.  HENT:  Negative for congestion, ear pain, postnasal drip,  rhinorrhea, sneezing and sore throat.        TMJ issue resolved.   Eyes:  Negative for redness.  Respiratory:  Negative for cough, chest tightness and shortness of breath.   Cardiovascular:  Negative for chest pain and palpitations.  Gastrointestinal:  Negative for abdominal pain, constipation, diarrhea, nausea and vomiting.  Genitourinary:  Negative for dysuria and frequency.  Musculoskeletal:  Negative for arthralgias, back pain, joint swelling and neck pain.  Skin:  Negative for rash.  Neurological: Negative.  Negative for tremors and numbness.  Hematological:  Negative for adenopathy. Does not bruise/bleed easily.   Psychiatric/Behavioral:  Positive for behavioral problems (Depression -doing better) and sleep disturbance (slight, taking melatonin). Negative for self-injury and suicidal ideas. The patient is nervous/anxious (doing better).     Vital Signs: BP 120/75   Pulse 93   Temp 98.5 F (36.9 C)   Resp 16   Ht 5\' 2"  (1.575 m)   Wt 157 lb (71.2 kg)   SpO2 97%   BMI 28.72 kg/m    Physical Exam Vitals reviewed.  Constitutional:      General: She is not in acute distress.    Appearance: Normal appearance. She is not ill-appearing.  HENT:     Head: Normocephalic and atraumatic.  Eyes:     Pupils: Pupils are equal, round, and reactive to light.  Cardiovascular:     Rate and Rhythm: Normal rate and regular rhythm.  Pulmonary:     Effort: Pulmonary effort is normal. No respiratory distress.  Neurological:     Mental Status: She is alert and oriented to person, place, and time.  Psychiatric:        Mood and Affect: Mood normal.        Behavior: Behavior normal.        Assessment/Plan: 1. Excessive daytime sleepiness Sleep study ordered  - PSG Sleep Study; Future  2. Hypersomnia Sleep study ordered  - PSG Sleep Study; Future   General Counseling: Patricia Wiley verbalizes understanding of the findings of todays visit and agrees with plan of treatment. I have discussed any further diagnostic evaluation that may be needed or ordered today. We also reviewed her medications today. she has been encouraged to call the office with any questions or concerns that should arise related to todays visit.    Orders Placed This Encounter  Procedures   PSG Sleep Study    No orders of the defined types were placed in this encounter.   Return for sleep study ordered, need follow up visit to discuss results thanks. .   Total time spent:20 Minutes Time spent includes review of chart, medications, test results, and follow up plan with the patient.   [place] Controlled Substance Database was  reviewed by me.  This patient was seen by Sallyanne Kuster, FNP-C in collaboration with Dr. Beverely Risen as a part of collaborative care agreement.    R. Tedd Sias, MSN, FNP-C Internal medicine

## 2023-02-10 NOTE — Telephone Encounter (Signed)
Awaiting 02/10/23 office notes for SS order-[facility]

## 2023-02-11 ENCOUNTER — Telehealth: Payer: Self-pay | Admitting: Nurse Practitioner

## 2023-02-11 NOTE — Telephone Encounter (Signed)
SS order faxed to [facility]; [phone]

## 2023-03-11 ENCOUNTER — Telehealth: Payer: Self-pay | Admitting: Nurse Practitioner

## 2023-03-11 NOTE — Telephone Encounter (Signed)
Home SS appointment 03/17/2023 @ [facility]

## 2023-03-17 ENCOUNTER — Encounter (INDEPENDENT_AMBULATORY_CARE_PROVIDER_SITE_OTHER): Payer: BC Managed Care – PPO | Admitting: Internal Medicine

## 2023-03-17 DIAGNOSIS — G4719 Other hypersomnia: Secondary | ICD-10-CM

## 2023-03-18 ENCOUNTER — Encounter (INDEPENDENT_AMBULATORY_CARE_PROVIDER_SITE_OTHER): Payer: BC Managed Care – PPO | Admitting: Internal Medicine

## 2023-03-18 DIAGNOSIS — G4719 Other hypersomnia: Secondary | ICD-10-CM

## 2023-04-02 ENCOUNTER — Ambulatory Visit: Payer: BC Managed Care – PPO | Admitting: Nurse Practitioner

## 2023-04-03 NOTE — Procedures (Signed)
[facility]  Portable Polysomnogram Report Part 1 Phone: [phone] Fax: [phone]  Patient Name: Patricia Wiley, Patricia Wiley. Recording Device: Tawana Scale  D.O.B.: August 25, 1995 Acquisition Number: [id number]  Referring Physician: Sallyanne Kuster, FNP-C Acquisition Date: 03/17/2023   History: The patient is a 27 year old female who was referred for evaluation of possible sleep apnea.  Medical History: depression.  Medications: alprazolam.  PROCEDURE  The unattended portable polysomnogram was conducted on the night of 03/17/2023.  The following parameters were monitored: Nasal and oral airflow, and body position. Additionally, thoracic and abdominal movements were recorded by inductance plethysmography. Oxygen saturation (SpO2) and heart rate (ECG) was monitored using a pulse oximeter.  The tracing was scored using 30 second epochs. Hypopneas were scored per AASM definition VIIID1.B (4% desaturation).    Description: The total recording time was 515.0 minutes. Sleep parameters are not recorded.  Respiratory monitoring demonstrated  snoring across the night in all positions. There were a total of 9 apneas and hypopneas for a Respiratory Event Index of 1.1 apneas and hypopneas per hour of recording. The average duration of the respiratory events was 14.7 seconds with a maximum duration of 19.5 seconds. The respiratory events were associated with peripheral oxygen desaturations on the average to 94 %. The lowest oxygen desaturation associated with a respiratory event was 84 %. Additionally, the mean oxygen saturation was 94 %. The total duration of oxygen < 90% was 90.4 minutes and <80% was 0.0 minutes.   Cardiac monitoring- The average heart rate during the recording was 64.8 bpm.  Impression: This routine overnight portable polysomnogram did not demonstrate obstructive sleep apnea. Overall the Respiratory Event Index was 1.1 apneas and hypopneas per hour of recording with  the lowest desaturation to 84 %.   Recommendations:     A negative home study does not necessarily rule out significant sleep apnea and a repeat study is recommended. Would recommend weight loss in a patient with a BMI of 28.7 lb/in2.  Yevonne Pax, MD [facility] Diplomate ABMS Pulmonary Critical Care and Sleep Medicine Electronically reviewed and digitally signed   [facility]  Portable Polysomnogram Report Part 2 Phone: [phone] Fax: [phone]   Study Date: 03/17/2023  Patient Name: Jerline, Ludwig. Recording Device: Tawana Scale  Sex: F Height: 62.0 in.  D.O.B.: May 25, 1996 Weight: 157.0 lbs.  Age: 14 years B.M.I: 28.7 lb/in2   Times and Durations  Lights off clock time:  9:09:33 PM Total Recording Time (TRT): 515.0 minutes  Lights on clock time: 5:44:33 AM Time In Bed (TIB): 515.0 minutes   Summary  AHI 1.1 OAI 0.4 CAI 0.0 Lowest Desat 84  AHI is the number of apneas and hypopneas per hour. OAI is the number of obstructive apneas per hour. CAI is the number of central apneas per hour. Lowest Desat is the lowest blood oxygen level that lasted at least 2 seconds.  RESPIRATORY EVENTS   Index (#/hour) Total # of Events Mean duration  (sec) Max duration  (sec) # of Events by Position       Supine Prone Left Right Up  Central Apneas 0.0 0 0.0 0.0 0    0 0 0  Obstructive Apneas 0.4 3 15.0 16.5 2    1  0 0  Mixed Apneas 0.7 6 14.6 19.5 4    2  0 0  Hypopneas 0.0 0 0.0 0.0 0    0 0 0  Apneas + Hypopneas 1.1 9 14.7 19.5 6  3 0 0  Total 1.1 9 14.7 19.5 6    3  0 0  Time in Position 262.1    166.2 80.3 1.9  AHI in Position 1.4    1.1 0.0 0.0     Oximetry Summary   Dur. (min) % TIB  <90 % 90.4 17.6  <85 % 0.0 0.0  <80 % 0.0 0.0  <70 % 0.0 0.0  Total Dur (min) < 89 81.1 min  Average (%) 94  Total # of Desats 42  Desat Index (#/hour) 5.0  Desat Max (%) 7  Desat Max dur (sec) 50.0  Lowest SpO2 % during sleep 84  Duration of Min SpO2 (sec) 3    Heart  Rate Stats  Mean HR during sleep (BPM)  Highest HR during sleep 91  (BPM)  Highest HR during TIB  91 (BPM)    Snoring Summary  Total Snoring Episodes 43  Total Duration with Snoring 16.0 minutes  Mean Duration of Snoring 22.3 seconds  Percentage of Snoring 3.1 %

## 2023-04-03 NOTE — Procedures (Signed)
[facility]  Portable Polysomnogram Report Part 1 Phone: [phone] Fax: [phone]  Patient Name: Patricia Wiley, Patricia Wiley. Recording Device: Tawana Scale  D.O.B.: Jun 15, 1996 Acquisition Number: [id number]  Referring Physician: Sallyanne Kuster, FNP-C Acquisition Date: 03/18/2023   History: The patient is a 27 year old female who was referred for re-evaluation of possible sleep apnea.  Medical History: depression.  Medications: alprazolam.  PROCEDURE  The unattended portable polysomnogram was conducted on the night of 03/18/2023.  The following parameters were monitored: Nasal and oral airflow, and body position. Additionally, thoracic and abdominal movements were recorded by inductance plethysmography. Oxygen saturation (SpO2) and heart rate (ECG) was monitored using a pulse oximeter.  The tracing was scored using 30 second epochs. Hypopneas were scored per AASM definition VIIID1.B (4% desaturation).   Description: The total recording time was 449.5 minutes. Sleep parameters are not recorded.  Respiratory monitoring demonstrated  snoring across the night in all positions. There were a total of 7 apneas and hypopneas for a Respiratory Event Index of 0.9 apneas and hypopneas per hour of recording. The average duration of the respiratory events was 16.6 seconds with a maximum duration of 22.0 seconds. The respiratory events were associated with peripheral oxygen desaturations on the average to 96 %. The lowest oxygen desaturation associated with a respiratory event was 92 %. Additionally, the mean oxygen saturation was 96 %. The total duration of oxygen < 90% was 0.0 minutes and <80% was 0.0 minutes.   Cardiac monitoring- The average heart rate during the recording was 66.4 bpm.  Impression: This repeat overnight portable polysomnogram did not demonstrate obstructive sleep apnea. Overall the Respiratory Event Index was 0.9 apneas and hypopneas per hour of recording with  the lowest desaturation to 92 %. The findings may be limited as oximetry data were missing for approximately 3 hours of the recording.  Recommendations:     Negative home studies do not necessarily rule out significant sleep apnea or other sleep disorders. If there is clinical suspicion for sleep apnea or another sleep disorder a fully attended, in laboratory study is recommended. Would recommend weight loss in a patient with a BMI of 28.7 lb/in2.  Yevonne Pax, MD [facility] Diplomate ABMS Pulmonary Critical Care and Sleep Medicine Electronically reviewed and digitally signed   [facility]  Portable Polysomnogram Report Part 2 Phone: [phone] Fax: [phone]   Study Date: 03/18/2023  Patient Name: Wiley, Patricia. Recording Device: Tawana Scale  Sex: F Height: 62.0 in.  D.O.B.: 01-07-1996 Weight: 157.0 lbs.  Age: 24 years B.M.I: 28.7 lb/in2   Times and Durations  Lights off clock time:  8:32:28 PM Total Recording Time (TRT): 449.5 minutes  Lights on clock time: 4:01:58 AM Time In Bed (TIB): 449.5 minutes   Summary  AHI 0.9 OAI 0.5 CAI 0.0 Lowest Desat 92  AHI is the number of apneas and hypopneas per hour. OAI is the number of obstructive apneas per hour. CAI is the number of central apneas per hour. Lowest Desat is the lowest blood oxygen level that lasted at least 2 seconds.  RESPIRATORY EVENTS   Index (#/hour) Total # of Events Mean duration  (sec) Max duration  (sec) # of Events by Position       Supine Prone Left Right Up  Central Apneas 0.0 0 0.0 0.0 0 0 0 0 0  Obstructive Apneas 0.5 4 15.0 22.0 2 0 1 1 0  Mixed Apneas 0.4 3 18.7 19.5 3 0 0 0  0  Hypopneas 0.0 0 0.0 0.0 0 0 0 0 0  Apneas + Hypopneas 0.9 7 16.6 22.0 5 0 1 1 0  Total 0.9 7 16.6 22.0 5 0 1 1 0  Time in Position 247.3 23.1 26.6 145.3 2.2  AHI in Position 1.2 0.0 2.3 0.4 0.0     Oximetry Summary   Dur. (min) % TIB  <90 % 0.0 0.0  <85 % 0.0 0.0  <80 % 0.0 0.0  <70 % 0.0 0.0  Total  Dur (min) < 89 0.0 min  Average (%) 96  Total # of Desats 23  Desat Index (#/hour) 5.3  Desat Max (%) 6  Desat Max dur (sec) 45.0  Lowest SpO2 % during sleep 92  Duration of Min SpO2 (sec) 21    Heart Rate Stats  Mean HR during sleep (BPM)  Highest HR during sleep 92  (BPM)  Highest HR during TIB  122 (BPM)    Snoring Summary  Total Snoring Episodes 2  Total Duration with Snoring 0.2 minutes  Mean Duration of Snoring 5.0 seconds  Percentage of Snoring 0.0 %

## 2023-04-21 ENCOUNTER — Ambulatory Visit (INDEPENDENT_AMBULATORY_CARE_PROVIDER_SITE_OTHER): Payer: BC Managed Care – PPO | Admitting: Nurse Practitioner

## 2023-04-21 ENCOUNTER — Encounter: Payer: Self-pay | Admitting: Nurse Practitioner

## 2023-04-21 VITALS — BP 110/78 | HR 85 | Temp 98.4°F | Resp 16 | Ht 62.0 in | Wt 151.0 lb

## 2023-04-21 DIAGNOSIS — G4719 Other hypersomnia: Secondary | ICD-10-CM

## 2023-04-21 DIAGNOSIS — E663 Overweight: Secondary | ICD-10-CM

## 2023-04-21 DIAGNOSIS — Z6827 Body mass index (BMI) 27.0-27.9, adult: Secondary | ICD-10-CM

## 2023-04-21 NOTE — Progress Notes (Signed)
[facility] [address] [place], [place] [address]  Internal MEDICINE  Office Visit Note  Patient Name: Patricia Wiley  [id number]  [id number]  Date of Service: 04/21/2023  Chief Complaint  Patient presents with   Depression   Follow-up    HPI [name] presents for a follow-up visit for sleep study results  Negative home sleep study, no OSA detected.  In-office study recommended but insurance will not cover right now. Recommend weight loss per sleepy study impression and recommendations per Dr. Freda Munro.     Current Medication: Outpatient Encounter Medications as of 04/21/2023  Medication Sig   ALPRAZolam (XANAX) 0.25 MG tablet Take 1 tablet (0.25 mg total) by mouth 2 (two) times daily as needed (severe anxiety/panic attack).   No facility-administered encounter medications on file as of 04/21/2023.    Surgical History: Past Surgical History:  Procedure Laterality Date   COLONOSCOPY WITH PROPOFOL N/A 03/08/2021   Procedure: COLONOSCOPY WITH PROPOFOL;  Surgeon: [name], MD;  Location: [facility] ENDOSCOPY;  Service: Gastroenterology;  Laterality: N/A;   tubes in ears      Medical History: Past Medical History:  Diagnosis Date   Depression     Family History: Family History  Problem Relation Age of Onset   Heart disease Mother     Social History   Socioeconomic History   Marital status: Significant Other    Spouse name: Not on file   Number of children: Not on file   Years of education: Not on file   Highest education level: Not on file  Occupational History   Not on file  Tobacco Use   Smoking status: Former    Current packs/day: 1.00    Types: Cigarettes   Smokeless tobacco: Never   Tobacco comments:    Quit 7/23  Vaping Use   Vaping status: Never Used  Substance and Sexual Activity   Alcohol use: Yes    Comment: socially   Drug use: No   Sexual activity: Yes    Birth control/protection: None    Comment: female partner   Other Topics Concern   Not on file  Social History Narrative   Not on file   Social Determinants of Health   Financial Resource Strain: Not on file  Food Insecurity: Not on file  Transportation Needs: Not on file  Physical Activity: Not on file  Stress: Not on file  Social Connections: Not on file  Intimate Partner Violence: Not on file      Review of Systems  Constitutional:  Positive for fatigue. Negative for chills and unexpected weight change.  HENT:  Negative for congestion, ear pain, postnasal drip, rhinorrhea, sneezing and sore throat.   Eyes:  Negative for redness.  Respiratory:  Negative for cough, chest tightness and shortness of breath.   Cardiovascular:  Negative for chest pain and palpitations.  Gastrointestinal:  Negative for abdominal pain, constipation, diarrhea, nausea and vomiting.  Genitourinary:  Negative for dysuria and frequency.  Musculoskeletal:  Negative for arthralgias, back pain, joint swelling and neck pain.  Skin:  Negative for rash.  Neurological: Negative.  Negative for tremors and numbness.  Hematological:  Negative for adenopathy. Does not bruise/bleed easily.  Psychiatric/Behavioral:  Positive for behavioral problems (Depression -doing better) and sleep disturbance (slight, taking melatonin). Negative for self-injury and suicidal ideas. The patient is nervous/anxious (doing better).     Vital Signs: BP 110/78   Pulse 85   Temp 98.4 F (36.9 C)   Resp 16  Ht 5\' 2"  (1.575 m)   Wt 151 lb (68.5 kg)   SpO2 99%   BMI 27.62 kg/m    Physical Exam Vitals reviewed.  Constitutional:      General: She is not in acute distress.    Appearance: Normal appearance. She is not ill-appearing.  HENT:     Head: Normocephalic and atraumatic.  Eyes:     Pupils: Pupils are equal, round, and reactive to light.  Cardiovascular:     Rate and Rhythm: Normal rate and regular rhythm.  Pulmonary:     Effort: Pulmonary effort is normal. No respiratory  distress.  Neurological:     Mental Status: She is alert and oriented to person, place, and time.  Psychiatric:        Mood and Affect: Mood normal.        Behavior: Behavior normal.        Assessment/Plan: 1. Excessive daytime sleepiness Try practicing good sleep hygiene, may continue melatonin if it is helping, call clinic if no improvement or worsening of sleep quality and/or duration.   2. Overweight with body mass index (BMI) of 27 to 27.9 in adult Encouraged weight loss, approx 5-10% of body weight. Work on diet and lifestyle modifications as discussed.    General Counseling: [name] verbalizes understanding of the findings of todays visit and agrees with plan of treatment. I have discussed any further diagnostic evaluation that may be needed or ordered today. We also reviewed her medications today. she has been encouraged to call the office with any questions or concerns that should arise related to todays visit.    No orders of the defined types were placed in this encounter.   No orders of the defined types were placed in this encounter.   Return if symptoms worsen or fail to improve, for readdress in-office sleep study in the new year, see if insurance will cover.   Total time spent:20 Minutes Time spent includes review of chart, medications, test results, and follow up plan with the patient.   [place] Controlled Substance Database was reviewed by me.  This patient was seen by Sallyanne Kuster, FNP-C in collaboration with Dr. Beverely Risen as a part of collaborative care agreement.   Kimika Streater R. Tedd Sias, MSN, FNP-C Internal medicine

## 2023-05-12 ENCOUNTER — Encounter: Payer: Self-pay | Admitting: Nurse Practitioner

## 2023-05-18 ENCOUNTER — Telehealth: Payer: Self-pay | Admitting: Nurse Practitioner

## 2023-05-19 DIAGNOSIS — F331 Major depressive disorder, recurrent, moderate: Secondary | ICD-10-CM | POA: Diagnosis not present

## 2023-05-21 NOTE — Telephone Encounter (Signed)
Error

## 2023-05-25 DIAGNOSIS — F331 Major depressive disorder, recurrent, moderate: Secondary | ICD-10-CM | POA: Diagnosis not present

## 2023-06-01 DIAGNOSIS — F331 Major depressive disorder, recurrent, moderate: Secondary | ICD-10-CM | POA: Diagnosis not present

## 2023-06-07 ENCOUNTER — Encounter: Payer: Self-pay | Admitting: Nurse Practitioner

## 2023-06-08 DIAGNOSIS — F331 Major depressive disorder, recurrent, moderate: Secondary | ICD-10-CM | POA: Diagnosis not present

## 2023-06-15 DIAGNOSIS — F331 Major depressive disorder, recurrent, moderate: Secondary | ICD-10-CM | POA: Diagnosis not present

## 2023-06-22 DIAGNOSIS — F331 Major depressive disorder, recurrent, moderate: Secondary | ICD-10-CM | POA: Diagnosis not present

## 2023-06-29 DIAGNOSIS — F331 Major depressive disorder, recurrent, moderate: Secondary | ICD-10-CM | POA: Diagnosis not present

## 2023-07-06 DIAGNOSIS — F331 Major depressive disorder, recurrent, moderate: Secondary | ICD-10-CM | POA: Diagnosis not present

## 2023-07-13 DIAGNOSIS — F331 Major depressive disorder, recurrent, moderate: Secondary | ICD-10-CM | POA: Diagnosis not present

## 2023-07-20 DIAGNOSIS — F331 Major depressive disorder, recurrent, moderate: Secondary | ICD-10-CM | POA: Diagnosis not present

## 2023-07-27 DIAGNOSIS — F331 Major depressive disorder, recurrent, moderate: Secondary | ICD-10-CM | POA: Diagnosis not present

## 2023-08-05 ENCOUNTER — Ambulatory Visit: Payer: BC Managed Care – PPO | Admitting: Nurse Practitioner

## 2023-08-10 DIAGNOSIS — F331 Major depressive disorder, recurrent, moderate: Secondary | ICD-10-CM | POA: Diagnosis not present

## 2023-08-17 DIAGNOSIS — F331 Major depressive disorder, recurrent, moderate: Secondary | ICD-10-CM | POA: Diagnosis not present

## 2023-08-24 DIAGNOSIS — F331 Major depressive disorder, recurrent, moderate: Secondary | ICD-10-CM | POA: Diagnosis not present

## 2023-08-25 DIAGNOSIS — J069 Acute upper respiratory infection, unspecified: Secondary | ICD-10-CM | POA: Diagnosis not present

## 2023-08-25 DIAGNOSIS — R519 Headache, unspecified: Secondary | ICD-10-CM | POA: Diagnosis not present

## 2023-08-25 DIAGNOSIS — R0982 Postnasal drip: Secondary | ICD-10-CM | POA: Diagnosis not present

## 2023-08-31 DIAGNOSIS — F331 Major depressive disorder, recurrent, moderate: Secondary | ICD-10-CM | POA: Diagnosis not present

## 2023-09-07 DIAGNOSIS — F331 Major depressive disorder, recurrent, moderate: Secondary | ICD-10-CM | POA: Diagnosis not present

## 2023-09-14 DIAGNOSIS — F331 Major depressive disorder, recurrent, moderate: Secondary | ICD-10-CM | POA: Diagnosis not present

## 2023-09-21 DIAGNOSIS — F331 Major depressive disorder, recurrent, moderate: Secondary | ICD-10-CM | POA: Diagnosis not present

## 2023-09-28 DIAGNOSIS — F331 Major depressive disorder, recurrent, moderate: Secondary | ICD-10-CM | POA: Diagnosis not present

## 2023-10-02 DIAGNOSIS — D2239 Melanocytic nevi of other parts of face: Secondary | ICD-10-CM | POA: Diagnosis not present

## 2023-10-02 DIAGNOSIS — L538 Other specified erythematous conditions: Secondary | ICD-10-CM | POA: Diagnosis not present

## 2023-10-05 DIAGNOSIS — F331 Major depressive disorder, recurrent, moderate: Secondary | ICD-10-CM | POA: Diagnosis not present

## 2023-10-12 DIAGNOSIS — F331 Major depressive disorder, recurrent, moderate: Secondary | ICD-10-CM | POA: Diagnosis not present

## 2023-10-19 DIAGNOSIS — F331 Major depressive disorder, recurrent, moderate: Secondary | ICD-10-CM | POA: Diagnosis not present

## 2023-10-26 DIAGNOSIS — F331 Major depressive disorder, recurrent, moderate: Secondary | ICD-10-CM | POA: Diagnosis not present

## 2023-11-02 DIAGNOSIS — F331 Major depressive disorder, recurrent, moderate: Secondary | ICD-10-CM | POA: Diagnosis not present

## 2023-11-09 DIAGNOSIS — F331 Major depressive disorder, recurrent, moderate: Secondary | ICD-10-CM | POA: Diagnosis not present

## 2023-11-16 DIAGNOSIS — F331 Major depressive disorder, recurrent, moderate: Secondary | ICD-10-CM | POA: Diagnosis not present

## 2023-11-23 DIAGNOSIS — F331 Major depressive disorder, recurrent, moderate: Secondary | ICD-10-CM | POA: Diagnosis not present

## 2023-11-30 DIAGNOSIS — F331 Major depressive disorder, recurrent, moderate: Secondary | ICD-10-CM | POA: Diagnosis not present

## 2023-12-14 DIAGNOSIS — F331 Major depressive disorder, recurrent, moderate: Secondary | ICD-10-CM | POA: Diagnosis not present

## 2023-12-21 DIAGNOSIS — F331 Major depressive disorder, recurrent, moderate: Secondary | ICD-10-CM | POA: Diagnosis not present

## 2023-12-23 ENCOUNTER — Other Ambulatory Visit (HOSPITAL_COMMUNITY)
Admission: RE | Admit: 2023-12-23 | Discharge: 2023-12-23 | Disposition: A | Discharge status: Home / Self Care | Source: Ambulatory Visit | Attending: Obstetrics | Admitting: Obstetrics

## 2023-12-23 ENCOUNTER — Ambulatory Visit (INDEPENDENT_AMBULATORY_CARE_PROVIDER_SITE_OTHER)

## 2023-12-23 VITALS — BP 112/75 | HR 94 | Ht 62.0 in | Wt 136.5 lb

## 2023-12-23 DIAGNOSIS — R399 Unspecified symptoms and signs involving the genitourinary system: Secondary | ICD-10-CM

## 2023-12-23 DIAGNOSIS — N898 Other specified noninflammatory disorders of vagina: Secondary | ICD-10-CM | POA: Diagnosis not present

## 2023-12-23 DIAGNOSIS — R8290 Unspecified abnormal findings in urine: Secondary | ICD-10-CM

## 2023-12-23 DIAGNOSIS — R1031 Right lower quadrant pain: Secondary | ICD-10-CM | POA: Diagnosis not present

## 2023-12-23 LAB — POCT URINALYSIS DIPSTICK
Bilirubin, UA: NEGATIVE
Blood, UA: NEGATIVE
Glucose, UA: NEGATIVE
Ketones, UA: NEGATIVE
Leukocytes, UA: NEGATIVE
Nitrite, UA: NEGATIVE
Protein, UA: NEGATIVE
Spec Grav, UA: 1.01 (ref 1.010–1.025)
Urobilinogen, UA: 0.2 U/dL
pH, UA: 7.5 (ref 5.0–8.0)

## 2023-12-23 NOTE — Progress Notes (Signed)
    NURSE VISIT NOTE  Subjective:    Patient ID: Patricia Wiley, female    DOB: 06-Aug-1995, 28 y.o.   MRN: [id number]  HPI  Patient is a 28 y.o. G0P0000 female who presents for green, mucoid, and yellow vaginal discharge for 3-4 day(s). Denies abnormal vaginal bleeding or significant pelvic pain or fever. reports abdominal pain, cloudy malordorous urine, genital irritation, and vaginal discharge. Patient denies history of known exposure to STD.   Objective:    BP 112/75   Pulse 94   Ht 5' 2 (1.575 m)   Wt 136 lb 8 oz (61.9 kg)   LMP  (Within Weeks) Comment: patient reports 3-4 weeks ago  BMI 24.97 kg/m    @THIS  VISIT ONLY@  Assessment:   1. Vaginal discharge   2. Cloudy urine   3. Right lower quadrant abdominal pain   4. Vaginal odor     rule out GC or chlamydia and nonspecific vaginitis  Plan:   GC and chlamydia DNA  probe sent to lab. Treatment: abstain from coitus during course of treatment ROV prn if symptoms persist or worsen.   Inga Manges, CMA

## 2023-12-23 NOTE — Patient Instructions (Signed)
 Vaginal Infection (Bacterial Vaginosis): What to Know  Bacterial vaginosis is an infection of the vagina. It happens when the balance of normal germs (bacteria) in the vagina changes. If you don't get treated, it can make it easier for you to get other infections from sex. These are called sexually transmitted infections (STIs). If you're pregnant, you need to get treated right away. This infection can cause a baby to be born early or at a low birth weight. What are the causes? This infection happens when too many harmful germs grow in the vagina. You can't get this infection from toilet seats, bedsheets, swimming pools, or things that touch your vagina. What increases the risk? Having sex with a new person or more than one person. Having sex without protection. Douching. Having an intrauterine device (IUD). Smoking. Using drugs or drinking alcohol. These can lead you to do risky things. Taking certain antibiotics. Being pregnant. What are the signs or symptoms? Some females have no symptoms. Symptoms may include: A gray or white discharge from your vagina. It can be watery or foamy. A fishy smell. This can happen after sex or during your menstrual period. Itching in and around your vagina. Burning or pain when you pee. How is this treated? This infection is treated with antibiotics. These may be given to you as: A pill. A cream for your vagina. A medicine that you put into your vagina (suppository). If the infection comes back, you may need more antibiotics. Follow these instructions at home: Medicines Take your medicines as told. Take or use your antibiotics as told. Do not stop using them even if you start to feel better. General instructions If the person you have sex with is a female, tell her that you have this infection. She will need to follow up with her doctor. Female partners don't need to be treated. Do not have sex until you finish treatment. Drink more fluids as  told. Keep your vagina and butt clean. Wash these areas with warm water each day. Wipe from front to back after you poop. If you're breastfeeding a baby, talk to your doctor if you should keep doing so during treatment. How is this prevented? Self-care Do not douche. Do not use deodorant sprays on your vagina. Wear cotton underwear. Do not wear tight pants and pantyhose, especially in the summer. Safe sex Use condoms the correct way and every time you have sex. Use dental dams to protect yourself during oral sex. Limit how many people you have sex with. Get tested for STIs. The person you have sex with should also get tested. Drugs and alcohol Do not smoke, vape, or use nicotine or tobacco. Do not use drugs. Limit the amount of alcohol you drink because it can lead you to do risky things. Where to find more information To learn more: Go to TonerPromos.no. Click Health Topics A-Z. Type "bacterial vaginosis" in the search bar. American Sexual Health Association (ASHA): ashasexualhealth.org U.S. Department of Health and [employer], Office on Women's Health: TravelLesson.ca Contact a doctor if: Your symptoms don't get better, even after treatment. You have more discharge or pain when you pee. You have a fever or chills. You have pain in your belly or in the area between your hips. You have pain during sex. You bleed from your vagina between menstrual periods. This information is not intended to replace advice given to you by your health care provider. Make sure you discuss any questions you have with your health care provider. Document  Revised: 12/17/2022 Document Reviewed: 12/17/2022 Elsevier Patient Education  2024 [employer].

## 2023-12-25 ENCOUNTER — Ambulatory Visit: Payer: Self-pay

## 2023-12-30 ENCOUNTER — Other Ambulatory Visit (HOSPITAL_COMMUNITY)
Admission: RE | Admit: 2023-12-30 | Discharge: 2023-12-30 | Disposition: A | Discharge status: Home / Self Care | Source: Ambulatory Visit | Attending: Obstetrics | Admitting: Obstetrics

## 2023-12-30 ENCOUNTER — Ambulatory Visit (INDEPENDENT_AMBULATORY_CARE_PROVIDER_SITE_OTHER): Admitting: Obstetrics

## 2023-12-30 ENCOUNTER — Encounter: Payer: Self-pay | Admitting: Obstetrics

## 2023-12-30 VITALS — BP 114/75 | HR 73 | Ht 62.0 in | Wt 135.8 lb

## 2023-12-30 DIAGNOSIS — N930 Postcoital and contact bleeding: Secondary | ICD-10-CM | POA: Insufficient documentation

## 2023-12-30 DIAGNOSIS — Z124 Encounter for screening for malignant neoplasm of cervix: Secondary | ICD-10-CM | POA: Diagnosis not present

## 2023-12-30 DIAGNOSIS — N898 Other specified noninflammatory disorders of vagina: Secondary | ICD-10-CM | POA: Diagnosis not present

## 2023-12-30 DIAGNOSIS — Z01419 Encounter for gynecological examination (general) (routine) without abnormal findings: Secondary | ICD-10-CM

## 2023-12-30 DIAGNOSIS — Z0000 Encounter for general adult medical examination without abnormal findings: Secondary | ICD-10-CM

## 2023-12-30 DIAGNOSIS — N760 Acute vaginitis: Secondary | ICD-10-CM | POA: Insufficient documentation

## 2023-12-30 DIAGNOSIS — Z113 Encounter for screening for infections with a predominantly sexual mode of transmission: Secondary | ICD-10-CM | POA: Diagnosis not present

## 2023-12-30 MED ORDER — FLUCONAZOLE 150 MG PO TABS: 150.0000 mg | ORAL_TABLET | Freq: Once | ORAL | 0 refills | Status: AC

## 2023-12-30 NOTE — Progress Notes (Signed)
 ANNUAL GYNECOLOGICAL EXAM  SUBJECTIVE  HPI  Patricia Wiley is a 28 y.o.-year-old G0P0000 who presents for an annual gynecological exam today.  She denies pelvic pain, dyspareunia,  and UTI symptoms. She does report postcoital bleeding. It is now happening every time she has penetrative sex. She reports that sex is not painful, but she sometimes experiences cramps and vaginal dryness. She also reports mood changes, irritability, cramping, and acne that are related to her cycle. A recent swab was positive for candida albicans and candida glabrata, but she has not yet started treatment.  Medical/Surgical History Past Medical History:  Diagnosis Date   Depression    Past Surgical History:  Procedure Laterality Date   COLONOSCOPY WITH PROPOFOL  N/A 03/08/2021   Procedure: COLONOSCOPY WITH PROPOFOL ;  Surgeon: Luke Salaam, MD;  Location: [facility] ENDOSCOPY;  Service: Gastroenterology;  Laterality: N/A;   tubes in ears      Social History Lives with girlfriend. Feels safe there Work:  Airline pilot for [employer] Exercise:  Substances: Denies EtOH, tobacco, vape. +MJ use Obstetric History OB History     Gravida  0   Para  0   Term  0   Preterm  0   AB  0   Living  0      SAB  0   IAB  0   Ectopic  0   Multiple  0   Live Births  0            GYN/Menstrual History No LMP recorded (within weeks). Regular monthly periods Last Pap: 10/02/21 NILM Contraception: female partner  Prevention Endorses regular dental and eye exams Mammogram: at 39 Colonoscopy: has had one already. Regular screening at 44.  Current Medications Outpatient Medications Prior to Visit  Medication Sig   ALPRAZolam  (XANAX ) 0.25 MG tablet Take 1 tablet (0.25 mg total) by mouth 2 (two) times daily as needed (severe anxiety/panic attack).   No facility-administered medications prior to visit.        ROS Constitutional: Denied constitutional symptoms, night sweats, recent illness, fatigue, fever,  insomnia and weight loss.  Eyes: Denied eye symptoms, eye pain, photophobia, vision change and visual disturbance.  Ears/Nose/Throat/Neck: Denied ear, nose, throat or neck symptoms, hearing loss, nasal discharge, sinus congestion and sore throat.  Cardiovascular: Denied cardiovascular symptoms, arrhythmia, chest pain/pressure, edema, exercise intolerance, orthopnea and palpitations.  Respiratory: Denied pulmonary symptoms, asthma, pleuritic pain, productive sputum, cough, dyspnea and wheezing.  Gastrointestinal: Denied gastro-esophageal reflux, melena, nausea and vomiting. +chronic constipation  Genitourinary: See HPI  Musculoskeletal: Denied musculoskeletal symptoms, stiffness, swelling, muscle weakness and myalgia.  Dermatologic: Denied dermatology symptoms, rash and scar. +acne  Neurologic: Denied neurology symptoms, dizziness, headache, neck pain and syncope.  Psychiatric: Anxiety/depression, managed without meds  Endocrine: +night sweats, excessive thirst    OBJECTIVE  BP 114/75 (BP Location: Right Arm, Patient Position: Sitting, Cuff Size: Normal)   Pulse 73   Ht 5' 2 (1.575 m)   Wt 135 lb 12.8 oz (61.6 kg)   LMP  (Within Weeks)   BMI 24.84 kg/m    Physical examination General NAD, Conversant  HEENT Atraumatic; Op clear with mmm.  Normo-cephalic. Pupils reactive. Anicteric sclerae  Thyroid /Neck Smooth without nodularity or enlargement. Normal ROM.  Neck Supple.  Skin No rashes, lesions or ulceration. Normal palpated skin turgor. No nodularity.  Breasts: No masses or discharge.  Symmetric.  No axillary adenopathy.  Lungs: Clear to auscultation.No rales or wheezes. Normal Respiratory effort, no retractions.  Heart: NSR.  No  murmurs or rubs appreciated. No peripheral edema  Abdomen: Soft.  Non-tender.  No masses.  No HSM. No hernia  Extremities: Moves all appropriately.  Normal ROM for age. No lymphadenopathy.  Neuro: Oriented to PPT.  Normal mood. Normal affect.     Pelvic:    Vulva: Normal appearance.  No lesions.  Vagina: No lesions or abnormalities noted. Small amount of adherent white discharge  Support: Normal pelvic support.  Urethra No masses tenderness or scarring.  Meatus Normal size without lesions or prolapse.  Cervix: Normal appearance.  No lesions. Small ectropion. Pap collected  Anus: Normal exam.  No lesions.  Perineum: Normal exam.  No lesions.        Bimanual   Uterus: Normal size.  Non-tender.  Mobile.  AV.  Adnexae: No masses.  Non-tender to palpation.  Cul-de-sac: Negative for abnormality.    ASSESSMENT  1) Annual exam 2) Postcoital bleeding 3) PMDD symptoms  PLAN 1) Physical exam as noted. Discussed healthy lifestyle choices and preventive care. Labs: A1C, TSH, lipid profile, CBC, CMP 2) Discussed possible causes of bleeding including infection or ectropion. Rx sent for Diflucan . Sybel has boric acid and will treat with that. Offered vaginal estrogen, but she declines at this time. Pap collected. Swab collected with mycoplasmas/ureaplasma. F/u based on results. 3) Recommend COCs, but Patricia prefers more natural methods. Discussed regular, vigorous exercise, dietary changes that include whole foods, fresh fruit/vegetables, healthy proteins,  and avoiding processed foods, refined sugar, white flour. Discussed herbal mood support including motherwort, lemon balm, and skullcap, and herbal hormonal support including vitex, red clover, and black cohosh.   Return in one year for annual exam or as needed for concerns.   Deshara Rossi, CNM

## 2023-12-31 LAB — HEMOGLOBIN A1C
Est. average glucose Bld gHb Est-mCnc: 97 mg/dL
Hgb A1c MFr Bld: 5 % (ref 4.8–5.6)

## 2023-12-31 LAB — LIPID PANEL
Chol/HDL Ratio: 2.6 ratio (ref 0.0–4.4)
Cholesterol, Total: 186 mg/dL (ref 100–199)
HDL: 71 mg/dL (ref >39)
LDL Chol Calc (NIH): 101 mg/dL — ABNORMAL HIGH (ref 0–99)
Triglycerides: 74 mg/dL (ref 0–149)
VLDL Cholesterol Cal: 14 mg/dL (ref 5–40)

## 2023-12-31 LAB — CBC
Hematocrit: 42.5 % (ref 34.0–46.6)
Hemoglobin: 13.5 g/dL (ref 11.1–15.9)
MCH: 30.2 pg (ref 26.6–33.0)
MCHC: 31.8 g/dL (ref 31.5–35.7)
MCV: 95 fL (ref 79–97)
Platelets: 306 10*3/uL (ref 150–450)
RBC: 4.47 x10E6/uL (ref 3.77–5.28)
RDW: 12 % (ref 11.7–15.4)
WBC: 7.8 10*3/uL (ref 3.4–10.8)

## 2023-12-31 LAB — COMPREHENSIVE METABOLIC PANEL WITH GFR
ALT: 15 IU/L (ref 0–32)
AST: 16 IU/L (ref 0–40)
Albumin: 4.5 g/dL (ref 4.0–5.0)
Alkaline Phosphatase: 47 IU/L (ref 44–121)
BUN/Creatinine Ratio: 20 (ref 9–23)
BUN: 14 mg/dL (ref 6–20)
Bilirubin Total: 0.3 mg/dL (ref 0.0–1.2)
CO2: 22 mmol/L (ref 20–29)
Calcium: 9.5 mg/dL (ref 8.7–10.2)
Chloride: 101 mmol/L (ref 96–106)
Creatinine, Ser: 0.69 mg/dL (ref 0.57–1.00)
Globulin, Total: 2.2 g/dL (ref 1.5–4.5)
Glucose: 77 mg/dL (ref 70–99)
Potassium: 3.8 mmol/L (ref 3.5–5.2)
Sodium: 139 mmol/L (ref 134–144)
Total Protein: 6.7 g/dL (ref 6.0–8.5)
eGFR: 120 mL/min/{1.73_m2} (ref >59)

## 2023-12-31 LAB — TSH: TSH: 1.05 u[IU]/mL (ref 0.450–4.500)

## 2024-01-02 LAB — NUSWAB VG PLUS+MYCOPLASMAS,NAA
Candida albicans, NAA: NEGATIVE
Candida glabrata, NAA: POSITIVE — AB
Chlamydia trachomatis, NAA: NEGATIVE
Mycoplasma genitalium NAA: NEGATIVE
Mycoplasma hominis NAA: NEGATIVE
Neisseria gonorrhoeae, NAA: NEGATIVE
Trich vag by NAA: NEGATIVE
Ureaplasma spp NAA: POSITIVE — AB

## 2024-01-03 ENCOUNTER — Other Ambulatory Visit: Payer: Self-pay | Admitting: Obstetrics

## 2024-01-03 ENCOUNTER — Encounter: Payer: Self-pay | Admitting: Obstetrics

## 2024-01-03 DIAGNOSIS — B379 Candidiasis, unspecified: Secondary | ICD-10-CM | POA: Insufficient documentation

## 2024-01-03 DIAGNOSIS — Z2239 Carrier of other specified bacterial diseases: Secondary | ICD-10-CM | POA: Insufficient documentation

## 2024-01-03 MED ORDER — DOXYCYCLINE HYCLATE 100 MG PO CAPS: 100.0000 mg | ORAL_CAPSULE | Freq: Two times a day (BID) | ORAL | 0 refills | Status: AC

## 2024-01-04 ENCOUNTER — Ambulatory Visit: Payer: Self-pay | Admitting: Obstetrics

## 2024-01-04 DIAGNOSIS — F331 Major depressive disorder, recurrent, moderate: Secondary | ICD-10-CM | POA: Diagnosis not present

## 2024-01-04 LAB — CYTOLOGY - PAP
Comment: NEGATIVE
Diagnosis: NEGATIVE
High risk HPV: NEGATIVE

## 2024-01-11 DIAGNOSIS — F331 Major depressive disorder, recurrent, moderate: Secondary | ICD-10-CM | POA: Diagnosis not present

## 2024-01-18 DIAGNOSIS — F331 Major depressive disorder, recurrent, moderate: Secondary | ICD-10-CM | POA: Diagnosis not present

## 2024-01-21 ENCOUNTER — Ambulatory Visit (INDEPENDENT_AMBULATORY_CARE_PROVIDER_SITE_OTHER): Admitting: Nurse Practitioner

## 2024-01-21 ENCOUNTER — Encounter: Payer: Self-pay | Admitting: Nurse Practitioner

## 2024-01-21 VITALS — BP 118/74 | Temp 98.3°F | Resp 16 | Ht 62.0 in | Wt 131.8 lb

## 2024-01-21 DIAGNOSIS — L235 Allergic contact dermatitis due to other chemical products: Secondary | ICD-10-CM

## 2024-01-21 MED ORDER — TRIAMCINOLONE ACETONIDE 0.1 % EX CREA: 1.0000 | TOPICAL_CREAM | Freq: Two times a day (BID) | CUTANEOUS | 0 refills | Status: AC

## 2024-01-21 MED ORDER — PREDNISONE 10 MG (21) PO TBPK: ORAL_TABLET | ORAL | 0 refills | Status: AC

## 2024-01-21 NOTE — Progress Notes (Signed)
 [facility] [address] [place], [place] [address]  Internal MEDICINE  Office Visit Note  Patient Name: Patricia Wiley  [id number]  [id number]  Date of Service: 01/21/2024  Chief Complaint  Patient presents with   Acute Visit    Fungal on face      HPI Patricia Wiley presents for an acute sick visit for itchy rash on face  --onset of rash on face about 8 or 9 days ago She already has acne but now has an itchy painful rash on face that is red and papular.  --tried a new skin care product -- a ginseng toner, rash started to develop, stopped using the product and it continued to worsen.       Current Medication:  Outpatient Encounter Medications as of 01/21/2024  Medication Sig   predniSONE  (STERAPRED UNI-PAK 21 TAB) 10 MG (21) TBPK tablet Use as directed for 6 days   triamcinolone  cream (KENALOG ) 0.1 % Apply 1 Application topically 2 (two) times daily. To rash on face until resolved.   ALPRAZolam  (XANAX ) 0.25 MG tablet Take 1 tablet (0.25 mg total) by mouth 2 (two) times daily as needed (severe anxiety/panic attack).   No facility-administered encounter medications on file as of 01/21/2024.      Medical History: Past Medical History:  Diagnosis Date   Depression      Vital Signs: BP 118/74   Temp 98.3 F (36.8 C)   Resp 16   Ht 5' 2 (1.575 m)   Wt 131 lb 12.8 oz (59.8 kg)   LMP  (Within Weeks)   BMI 24.11 kg/m    Review of Systems  Constitutional: Negative.  Negative for fatigue.  Respiratory: Negative.  Negative for cough, chest tightness, shortness of breath and wheezing.   Cardiovascular: Negative.  Negative for chest pain and palpitations.  Gastrointestinal: Negative.   Skin:  Positive for rash (on face).    Physical Exam Vitals reviewed.  Constitutional:      General: She is not in acute distress.    Appearance: Normal appearance. She is normal weight.  HENT:     Head: Normocephalic and atraumatic.  Eyes:     Pupils: Pupils are  equal, round, and reactive to light.  Cardiovascular:     Rate and Rhythm: Normal rate and regular rhythm.  Skin:    General: Skin is warm and dry.     Capillary Refill: Capillary refill takes less than 2 seconds.     Findings: Rash present. Rash is papular.     Comments: Itchy painful papular red rash on face   Neurological:     Mental Status: She is alert and oriented to person, place, and time.  Psychiatric:        Mood and Affect: Mood normal.        Behavior: Behavior normal.       Assessment/Plan: 1. Allergic contact dermatitis due to chemical (Primary) Prednisone  taper and topical steroid prescribed, take prednisone  until gone. Use topical steroid as prescribed. Rash should resolve within a couple of weeks.  - predniSONE  (STERAPRED UNI-PAK 21 TAB) 10 MG (21) TBPK tablet; Use as directed for 6 days  Dispense: 21 tablet; Refill: 0 - triamcinolone  cream (KENALOG ) 0.1 %; Apply 1 Application topically 2 (two) times daily. To rash on face until resolved.  Dispense: 45 g; Refill: 0   General Counseling: Patricia Wiley verbalizes understanding of the findings of todays visit and agrees with plan of treatment. I have discussed any further diagnostic evaluation  that may be needed or ordered today. We also reviewed her medications today. she has been encouraged to call the office with any questions or concerns that should arise related to todays visit.    Counseling:    No orders of the defined types were placed in this encounter.   Meds ordered this encounter  Medications   predniSONE  (STERAPRED UNI-PAK 21 TAB) 10 MG (21) TBPK tablet    Sig: Use as directed for 6 days    Dispense:  21 tablet    Refill:  0   triamcinolone  cream (KENALOG ) 0.1 %    Sig: Apply 1 Application topically 2 (two) times daily. To rash on face until resolved.    Dispense:  45 g    Refill:  0    Fill new script today    Return if symptoms worsen or fail to improve.  [place] Controlled Substance Database was  reviewed by me for overdose risk score (ORS)  Time spent:30 Minutes Time spent with patient included reviewing progress notes, labs, imaging studies, and discussing plan for follow up.   This patient was seen by Mardy Maxin, FNP-C in collaboration with Dr. Sigrid Bathe as a part of collaborative care agreement.  Borna Wessinger R. Maxin, MSN, FNP-C Internal Medicine

## 2024-02-01 DIAGNOSIS — F331 Major depressive disorder, recurrent, moderate: Secondary | ICD-10-CM | POA: Diagnosis not present

## 2024-02-15 DIAGNOSIS — F331 Major depressive disorder, recurrent, moderate: Secondary | ICD-10-CM | POA: Diagnosis not present

## 2024-02-16 DIAGNOSIS — L710 Perioral dermatitis: Secondary | ICD-10-CM | POA: Diagnosis not present

## 2024-02-16 DIAGNOSIS — L700 Acne vulgaris: Secondary | ICD-10-CM | POA: Diagnosis not present

## 2024-02-22 DIAGNOSIS — F331 Major depressive disorder, recurrent, moderate: Secondary | ICD-10-CM | POA: Diagnosis not present

## 2024-03-07 ENCOUNTER — Encounter: Payer: Self-pay | Admitting: Nurse Practitioner

## 2024-03-07 DIAGNOSIS — F331 Major depressive disorder, recurrent, moderate: Secondary | ICD-10-CM | POA: Diagnosis not present

## 2024-03-07 DIAGNOSIS — F339 Major depressive disorder, recurrent, unspecified: Secondary | ICD-10-CM

## 2024-03-07 MED ORDER — SERTRALINE HCL 50 MG PO TABS: 50.0000 mg | ORAL_TABLET | Freq: Every day | ORAL | 3 refills | Status: AC

## 2024-03-28 DIAGNOSIS — F331 Major depressive disorder, recurrent, moderate: Secondary | ICD-10-CM | POA: Diagnosis not present

## 2024-04-04 DIAGNOSIS — F331 Major depressive disorder, recurrent, moderate: Secondary | ICD-10-CM | POA: Diagnosis not present

## 2024-04-11 DIAGNOSIS — F331 Major depressive disorder, recurrent, moderate: Secondary | ICD-10-CM | POA: Diagnosis not present

## 2024-04-21 ENCOUNTER — Encounter: Payer: Self-pay | Admitting: Obstetrics

## 2024-04-21 ENCOUNTER — Ambulatory Visit: Admitting: Obstetrics

## 2024-04-21 VITALS — BP 121/70 | HR 98 | Ht 62.0 in | Wt 130.0 lb

## 2024-04-21 DIAGNOSIS — R102 Pelvic and perineal pain unspecified side: Secondary | ICD-10-CM | POA: Diagnosis not present

## 2024-04-21 DIAGNOSIS — N926 Irregular menstruation, unspecified: Secondary | ICD-10-CM

## 2024-04-21 DIAGNOSIS — N939 Abnormal uterine and vaginal bleeding, unspecified: Secondary | ICD-10-CM | POA: Diagnosis not present

## 2024-04-21 MED ORDER — LO LOESTRIN FE 1 MG-10 MCG / 10 MCG PO TABS: 1.0000 | ORAL_TABLET | Freq: Every day | ORAL | 11 refills | Status: DC

## 2024-04-21 MED ORDER — NORETHIN-ETH ESTRAD-FE BIPHAS 1 MG-10 MCG / 10 MCG PO TABS: 1.0000 | ORAL_TABLET | Freq: Every day | ORAL | 3 refills | Status: DC

## 2024-04-21 NOTE — Progress Notes (Signed)
   GYN ENCOUNTER  Subjective  HPI: Patricia Wiley is a 28 y.o. G0P0000 who presents today for pelvic pain and vaginal bleeding. She has been experiencing pelvic fullness. She has sharp pain in alternating sides each month. Her periods are heavy and painful. She has breast pain and swelling throughout the month and acne. She has recently started taking sertraline  and Xanax  for anxiety.  Past Medical History:  Diagnosis Date   Depression    Past Surgical History:  Procedure Laterality Date   COLONOSCOPY WITH PROPOFOL  N/A 03/08/2021   Procedure: COLONOSCOPY WITH PROPOFOL ;  Surgeon: Therisa Bi, MD;  Location: [facility] ENDOSCOPY;  Service: Gastroenterology;  Laterality: N/A;   tubes in ears     OB History     Gravida  0   Para  0   Term  0   Preterm  0   AB  0   Living  0      SAB  0   IAB  0   Ectopic  0   Multiple  0   Live Births  0          No Active Allergies  ROS: See HPI  Objective  BP 121/70   Pulse 98   Ht 5' 2 (1.575 m)   Wt 130 lb (59 kg)   BMI 23.78 kg/m   Physical examination General: Alert, cooperative, NAD  Assessment -Abnormal uterine bleeding -Pelvic pain  Plan -Discussed options for cycle control. Patricia has used Lo Loestrin in the past with good success. She would to start this again. Rx sent. Discussed proper administration, side effects, and danger signs.  -Pelvic US  ordered  Eleanor Canny, CNM

## 2024-04-25 ENCOUNTER — Other Ambulatory Visit

## 2024-04-25 DIAGNOSIS — F331 Major depressive disorder, recurrent, moderate: Secondary | ICD-10-CM | POA: Diagnosis not present

## 2024-04-26 LAB — FSH/LH
FSH: 3 m[IU]/mL
LH: 13 m[IU]/mL

## 2024-04-26 LAB — TESTOSTERONE, FREE, TOTAL, SHBG
Sex Hormone Binding: 109 nmol/L (ref 24.6–122.0)
Testosterone, Free: 2.6 pg/mL (ref 0.0–4.2)
Testosterone: 46 ng/dL (ref 13–71)

## 2024-05-11 ENCOUNTER — Telehealth: Payer: Self-pay | Admitting: Obstetrics

## 2024-05-11 ENCOUNTER — Other Ambulatory Visit

## 2024-05-11 NOTE — Telephone Encounter (Signed)
 Reached out to pt about pelvic US  that was scheduled on 05/11/2024 at 2:00 per Missy.  Left message for pt to call back to reschedule the US .

## 2024-05-12 NOTE — Telephone Encounter (Signed)
 Pt has rescheduled pelvic US  for 11/11/205 at 9:15 per Missy.

## 2024-05-24 ENCOUNTER — Ambulatory Visit

## 2024-05-24 DIAGNOSIS — N926 Irregular menstruation, unspecified: Secondary | ICD-10-CM

## 2024-06-01 ENCOUNTER — Other Ambulatory Visit: Payer: Self-pay

## 2024-06-01 ENCOUNTER — Ambulatory Visit: Payer: Self-pay | Admitting: Obstetrics

## 2024-06-01 ENCOUNTER — Other Ambulatory Visit: Payer: Self-pay | Admitting: Obstetrics

## 2024-06-01 MED ORDER — NORETHIN-ETH ESTRAD-FE BIPHAS 1 MG-10 MCG / 10 MCG PO TABS
1.0000 | ORAL_TABLET | Freq: Every day | ORAL | 3 refills | Status: DC
  Filled 2024-06-01: qty 84, 84d supply, fill #0

## 2024-06-01 MED ORDER — NORETHIN ACE-ETH ESTRAD-FE 1-20 MG-MCG(24) PO TABS
1.0000 | ORAL_TABLET | Freq: Every day | ORAL | 11 refills | Status: AC
  Filled 2024-06-01: qty 28, 28d supply, fill #0
